# Patient Record
Sex: Male | Born: 1965 | Race: White | Hispanic: No | Marital: Married | Smoking: Never smoker
Health system: Southern US, Community
[De-identification: ages and names within clinical notes are randomized; demographics above are authoritative.]

## PROBLEM LIST (undated history)

## (undated) DIAGNOSIS — K5790 Diverticulosis of intestine, part unspecified, without perforation or abscess without bleeding: Secondary | ICD-10-CM

## (undated) DIAGNOSIS — M109 Gout, unspecified: Secondary | ICD-10-CM

## (undated) DIAGNOSIS — E785 Hyperlipidemia, unspecified: Secondary | ICD-10-CM

## (undated) DIAGNOSIS — F329 Major depressive disorder, single episode, unspecified: Secondary | ICD-10-CM

## (undated) DIAGNOSIS — G43909 Migraine, unspecified, not intractable, without status migrainosus: Secondary | ICD-10-CM

## (undated) DIAGNOSIS — F32A Depression, unspecified: Secondary | ICD-10-CM

## (undated) DIAGNOSIS — M1990 Unspecified osteoarthritis, unspecified site: Secondary | ICD-10-CM

## (undated) DIAGNOSIS — K219 Gastro-esophageal reflux disease without esophagitis: Secondary | ICD-10-CM

## (undated) DIAGNOSIS — C4491 Basal cell carcinoma of skin, unspecified: Secondary | ICD-10-CM

## (undated) DIAGNOSIS — F419 Anxiety disorder, unspecified: Secondary | ICD-10-CM

## (undated) HISTORY — DX: Hyperlipidemia, unspecified: E78.5

## (undated) HISTORY — PX: WISDOM TOOTH EXTRACTION: SHX21

## (undated) HISTORY — DX: Unspecified osteoarthritis, unspecified site: M19.90

## (undated) HISTORY — DX: Gastro-esophageal reflux disease without esophagitis: K21.9

## (undated) HISTORY — PX: APPENDECTOMY: SHX54

## (undated) HISTORY — DX: Major depressive disorder, single episode, unspecified: F32.9

## (undated) HISTORY — DX: Anxiety disorder, unspecified: F41.9

## (undated) HISTORY — DX: Migraine, unspecified, not intractable, without status migrainosus: G43.909

## (undated) HISTORY — PX: LUMBAR MICRODISCECTOMY: SHX99

## (undated) HISTORY — DX: Depression, unspecified: F32.A

## (undated) HISTORY — DX: Gout, unspecified: M10.9

## (undated) HISTORY — DX: Basal cell carcinoma of skin, unspecified: C44.91

---

## 2010-04-05 HISTORY — PX: ANTERIOR CRUCIATE LIGAMENT REPAIR: SHX115

## 2012-12-19 ENCOUNTER — Other Ambulatory Visit: Payer: Self-pay | Admitting: Family Medicine

## 2012-12-20 ENCOUNTER — Other Ambulatory Visit: Payer: Self-pay | Admitting: *Deleted

## 2012-12-20 MED ORDER — FENOFIBRATE 120 MG PO TABS: 1.0000 | ORAL_TABLET | Freq: Every day | ORAL | Status: DC

## 2013-01-09 ENCOUNTER — Other Ambulatory Visit: Payer: Self-pay | Admitting: *Deleted

## 2013-01-09 DIAGNOSIS — E785 Hyperlipidemia, unspecified: Secondary | ICD-10-CM

## 2013-01-10 ENCOUNTER — Other Ambulatory Visit: Payer: Self-pay

## 2013-01-10 LAB — COMPLETE METABOLIC PANEL WITH GFR
ALT: 34 U/L (ref 0–53)
AST: 31 U/L (ref 0–37)
Albumin: 4.5 g/dL (ref 3.5–5.2)
Alkaline Phosphatase: 36 U/L — ABNORMAL LOW (ref 39–117)
BUN: 26 mg/dL — ABNORMAL HIGH (ref 6–23)
CO2: 26 mEq/L (ref 19–32)
Calcium: 9.5 mg/dL (ref 8.4–10.5)
Chloride: 104 mEq/L (ref 96–112)
Creat: 1.51 mg/dL — ABNORMAL HIGH (ref 0.50–1.35)
GFR, Est African American: 63 mL/min
GFR, Est Non African American: 55 mL/min — ABNORMAL LOW
Glucose, Bld: 98 mg/dL (ref 70–99)
Potassium: 4.4 mEq/L (ref 3.5–5.3)
Sodium: 140 mEq/L (ref 135–145)
Total Bilirubin: 0.8 mg/dL (ref 0.3–1.2)
Total Protein: 6.9 g/dL (ref 6.0–8.3)

## 2013-01-10 LAB — LIPID PANEL
Cholesterol: 146 mg/dL (ref 0–200)
HDL: 54 mg/dL (ref >39)
LDL Cholesterol: 70 mg/dL (ref 0–99)
Total CHOL/HDL Ratio: 2.7 Ratio
Triglycerides: 111 mg/dL (ref <150)
VLDL: 22 mg/dL (ref 0–40)

## 2013-01-17 ENCOUNTER — Ambulatory Visit: Payer: Self-pay | Admitting: Family Medicine

## 2013-01-19 ENCOUNTER — Ambulatory Visit: Payer: Self-pay | Admitting: Family Medicine

## 2013-01-19 ENCOUNTER — Encounter: Payer: Self-pay | Admitting: Family Medicine

## 2013-01-19 ENCOUNTER — Ambulatory Visit (INDEPENDENT_AMBULATORY_CARE_PROVIDER_SITE_OTHER): Payer: BC Managed Care – PPO | Admitting: Family Medicine

## 2013-01-19 VITALS — BP 120/66 | HR 64 | Wt 195.0 lb

## 2013-01-19 DIAGNOSIS — E785 Hyperlipidemia, unspecified: Secondary | ICD-10-CM

## 2013-01-19 DIAGNOSIS — M109 Gout, unspecified: Secondary | ICD-10-CM

## 2013-01-19 DIAGNOSIS — K219 Gastro-esophageal reflux disease without esophagitis: Secondary | ICD-10-CM

## 2013-01-19 DIAGNOSIS — M549 Dorsalgia, unspecified: Secondary | ICD-10-CM

## 2013-01-19 MED ORDER — ALLOPURINOL 300 MG PO TABS: 300.0000 mg | ORAL_TABLET | Freq: Every day | ORAL | Status: DC

## 2013-01-19 MED ORDER — PANTOPRAZOLE SODIUM 40 MG PO TBEC: 40.0000 mg | DELAYED_RELEASE_TABLET | Freq: Every day | ORAL | Status: DC

## 2013-01-19 MED ORDER — ROSUVASTATIN CALCIUM 40 MG PO TABS: 40.0000 mg | ORAL_TABLET | Freq: Every day | ORAL | Status: DC

## 2013-01-19 MED ORDER — COLCHICINE 0.6 MG PO TABS: 0.6000 mg | ORAL_TABLET | Freq: Two times a day (BID) | ORAL | Status: AC

## 2013-01-19 MED ORDER — FENOFIBRATE 120 MG PO TABS: 1.0000 | ORAL_TABLET | Freq: Every day | ORAL | Status: DC

## 2013-01-19 NOTE — Patient Instructions (Addendum)
1)  Back - Consider getting a Back Magic and a chiropractic adjustment (Dr. Christell Faith - [place]) vs (Dr. Bryson Ha [facility])  2)   Ear - ENT vs Dr. Bryson Ha  3)  Cholesterol - Perfect on current meds  4)   Kidney Function - Limit the amount of Mobic and other anti-inflammatory (Naproxen, BC. Ibuprofen) and add Tylenol instead.      Chronic Back Pain  When back pain lasts longer than 3 months, it is called chronic back pain.People with chronic back pain often go through certain periods that are more intense (flare-ups).  CAUSES Chronic back pain can be caused by wear and tear (degeneration) on different structures in your back. These structures include:  The bones of your spine (vertebrae) and the joints surrounding your spinal cord and nerve roots (facets).  The strong, fibrous tissues that connect your vertebrae (ligaments). Degeneration of these structures may result in pressure on your nerves. This can lead to constant pain. HOME CARE INSTRUCTIONS  Avoid bending, heavy lifting, prolonged sitting, and activities which make the problem worse.  Take brief periods of rest throughout the day to reduce your pain. Lying down or standing usually is better than sitting while you are resting.  Take over-the-counter or prescription medicines only as directed by your caregiver. SEEK IMMEDIATE MEDICAL CARE IF:   You have weakness or numbness in one of your legs or feet.  You have trouble controlling your bladder or bowels.  You have nausea, vomiting, abdominal pain, shortness of breath, or fainting. Document Released: 07/30/2004 Document Revised: 09/14/2011 Document Reviewed: 06/06/2011 [facility] Patient Information 2014 [place], [place].

## 2013-01-19 NOTE — Progress Notes (Signed)
  Subjective:    Patient ID: Chad Gonzales, male    DOB: 15-May-1966, 47 y.o.   MRN: [id number]  HPI  Tawanna Cooler is here today to go over his most recent lab results, get medication refills and discuss the following conditions:    1)  Ear Pressure:  He continues to feel as if he has fluid in his right ear.  He feels that some days his right ear gets irritated as well.     2)  Gout:  He is well controlled on Allopurinol.   He takes Colcrys when he gets a flare up.  He needs both medications refilled.    3)  Back Pain:  His pain is well controlled with meloxicam and tramadol.  He needs a refill on both.   4)  Mood:  His mood is controlled on Celexa.    5)  Hyperlipidemia:  He continues taking Fenofibrate and Crestor and needs both medications refilled.    Review of Systems  Constitutional: Negative for fatigue.  HENT: Positive for ear pain.        Right ear  Respiratory: Negative.   Cardiovascular: Negative.   Gastrointestinal: Negative.   Genitourinary: Negative.   Musculoskeletal: Negative for myalgias.  Skin: Negative.   Neurological: Negative.   Psychiatric/Behavioral: Negative.     Past Medical History  Diagnosis Date  . Gout, unspecified   . Other and unspecified hyperlipidemia   . Anxiety   . Depression   . Migraine headache   . Basal cell carcinoma   . GERD (gastroesophageal reflux disease)     Family History  Problem Relation Age of Onset  . Cancer Mother     Lung Cancer  . Heart disease Father   . Diabetes Maternal Grandmother     History   Social History Narrative   Marital Status:  Married Chief of Staff)   Children:  Son (Adam); Daughter Irving Burton)    Pets:  Boxer Facilities manager)    Living Situation: Lives with spouse and children.   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  He quit in 04/2009    Alcohol Use: Beer (12 pack/Week)    Drug Use:  None    Diet:  Regular    Exercise:  Basketball, Softball   Hobbies:  Fishing, Golf, Coaching                Objective:   Physical Exam  Constitutional: He appears well-nourished.  HENT:  Head: Normocephalic.  Nose: Nose normal.  Mouth/Throat: Oropharynx is clear and moist.  Eyes: Conjunctivae are normal. No scleral icterus.  Neck: Neck supple. No thyromegaly present.  Cardiovascular: Normal rate, regular rhythm and normal heart sounds.   Pulmonary/Chest: Effort normal and breath sounds normal.  Abdominal: Soft. He exhibits no mass. There is no tenderness.  Musculoskeletal: Normal range of motion.  Lymphadenopathy:    He has no cervical adenopathy.  Neurological: He is alert.  Skin: Skin is warm and dry. No rash noted.  Psychiatric: He has a normal mood and affect. His behavior is normal. Judgment and thought content normal.      Assessment & Plan:

## 2013-02-26 DIAGNOSIS — M549 Dorsalgia, unspecified: Secondary | ICD-10-CM | POA: Insufficient documentation

## 2013-02-26 DIAGNOSIS — E785 Hyperlipidemia, unspecified: Secondary | ICD-10-CM | POA: Insufficient documentation

## 2013-02-26 DIAGNOSIS — K219 Gastro-esophageal reflux disease without esophagitis: Secondary | ICD-10-CM | POA: Insufficient documentation

## 2013-02-26 DIAGNOSIS — M109 Gout, unspecified: Secondary | ICD-10-CM | POA: Insufficient documentation

## 2013-02-26 NOTE — Assessment & Plan Note (Signed)
He will take a combination of Mobic, Ultram and Tylenol.  We also discussed him getting a Back Magic and a chiropractic adjustment.

## 2013-02-26 NOTE — Assessment & Plan Note (Signed)
Refilled his Fenoglide and Crestor.

## 2013-02-26 NOTE — Assessment & Plan Note (Signed)
Refilled his Colcrys and allopurinol.

## 2013-02-26 NOTE — Assessment & Plan Note (Signed)
Refilled his Protonix.   

## 2013-07-18 ENCOUNTER — Other Ambulatory Visit: Payer: Self-pay | Admitting: Family Medicine

## 2013-07-19 ENCOUNTER — Other Ambulatory Visit: Payer: Self-pay | Admitting: *Deleted

## 2013-07-19 DIAGNOSIS — E785 Hyperlipidemia, unspecified: Secondary | ICD-10-CM

## 2013-07-19 DIAGNOSIS — R5383 Other fatigue: Secondary | ICD-10-CM

## 2013-07-19 DIAGNOSIS — R5381 Other malaise: Secondary | ICD-10-CM

## 2013-07-20 ENCOUNTER — Other Ambulatory Visit: Payer: BC Managed Care – PPO

## 2013-07-20 LAB — LIPID PANEL
Cholesterol: 183 mg/dL (ref 0–200)
HDL: 51 mg/dL (ref >39)
LDL Cholesterol: 93 mg/dL (ref 0–99)
Total CHOL/HDL Ratio: 3.6 Ratio
Triglycerides: 196 mg/dL — ABNORMAL HIGH (ref <150)
VLDL: 39 mg/dL (ref 0–40)

## 2013-07-20 LAB — CBC WITH DIFFERENTIAL/PLATELET
Basophils Absolute: 0 10*3/uL (ref 0.0–0.1)
Basophils Relative: 1 % (ref 0–1)
Eosinophils Absolute: 0.2 10*3/uL (ref 0.0–0.7)
Eosinophils Relative: 3 % (ref 0–5)
HCT: 42.3 % (ref 39.0–52.0)
Hemoglobin: 14.9 g/dL (ref 13.0–17.0)
Lymphocytes Relative: 31 % (ref 12–46)
Lymphs Abs: 1.8 10*3/uL (ref 0.7–4.0)
MCH: 31.6 pg (ref 26.0–34.0)
MCHC: 35.2 g/dL (ref 30.0–36.0)
MCV: 89.6 fL (ref 78.0–100.0)
Monocytes Absolute: 0.5 10*3/uL (ref 0.1–1.0)
Monocytes Relative: 8 % (ref 3–12)
Neutro Abs: 3.2 10*3/uL (ref 1.7–7.7)
Neutrophils Relative %: 57 % (ref 43–77)
Platelets: 221 10*3/uL (ref 150–400)
RBC: 4.72 MIL/uL (ref 4.22–5.81)
RDW: 13.3 % (ref 11.5–15.5)
WBC: 5.7 10*3/uL (ref 4.0–10.5)

## 2013-07-20 LAB — COMPLETE METABOLIC PANEL WITH GFR
ALT: 35 U/L (ref 0–53)
AST: 19 U/L (ref 0–37)
Albumin: 4.6 g/dL (ref 3.5–5.2)
Alkaline Phosphatase: 46 U/L (ref 39–117)
BUN: 20 mg/dL (ref 6–23)
CO2: 27 mEq/L (ref 19–32)
Calcium: 9.4 mg/dL (ref 8.4–10.5)
Chloride: 100 mEq/L (ref 96–112)
Creat: 1.27 mg/dL (ref 0.50–1.35)
GFR, Est African American: 77 mL/min
GFR, Est Non African American: 67 mL/min
Glucose, Bld: 94 mg/dL (ref 70–99)
Potassium: 4.4 mEq/L (ref 3.5–5.3)
Sodium: 138 mEq/L (ref 135–145)
Total Bilirubin: 0.9 mg/dL (ref 0.3–1.2)
Total Protein: 7 g/dL (ref 6.0–8.3)

## 2013-07-21 LAB — TSH: TSH: 4.449 u[IU]/mL (ref 0.350–4.500)

## 2013-07-25 ENCOUNTER — Encounter: Payer: Self-pay | Admitting: Family Medicine

## 2013-07-25 ENCOUNTER — Ambulatory Visit (INDEPENDENT_AMBULATORY_CARE_PROVIDER_SITE_OTHER): Payer: BC Managed Care – PPO | Admitting: Family Medicine

## 2013-07-25 VITALS — BP 131/87 | HR 72 | Resp 16 | Ht 69.0 in | Wt 191.0 lb

## 2013-07-25 DIAGNOSIS — E785 Hyperlipidemia, unspecified: Secondary | ICD-10-CM

## 2013-07-25 DIAGNOSIS — M109 Gout, unspecified: Secondary | ICD-10-CM

## 2013-07-25 DIAGNOSIS — K219 Gastro-esophageal reflux disease without esophagitis: Secondary | ICD-10-CM

## 2013-07-25 MED ORDER — PANTOPRAZOLE SODIUM 40 MG PO TBEC: 40.0000 mg | DELAYED_RELEASE_TABLET | Freq: Every day | ORAL | Status: DC

## 2013-07-25 MED ORDER — ALLOPURINOL 300 MG PO TABS: 300.0000 mg | ORAL_TABLET | Freq: Every day | ORAL | Status: DC

## 2013-07-25 MED ORDER — ATORVASTATIN CALCIUM 40 MG PO TABS: 40.0000 mg | ORAL_TABLET | Freq: Every day | ORAL | Status: AC

## 2013-07-25 MED ORDER — FENOFIBRATE 145 MG PO TABS: 145.0000 mg | ORAL_TABLET | Freq: Every day | ORAL | Status: DC

## 2013-07-25 MED ORDER — ROSUVASTATIN CALCIUM 40 MG PO TABS: ORAL_TABLET | ORAL | Status: DC

## 2013-07-25 NOTE — Progress Notes (Signed)
Subjective:    Patient ID: Chad Gonzales, male    DOB: June 05, 1966, 48 y.o.   MRN: [id number]  HPI  Chad Gonzales is here today to go over his most recent lab results and to discuss the conditions listed below:   1)  Hyperlipidemia:  He takes a combination of Crestor 20 mg and Fenoglide 120 mg daily.  His insurance is not covering the Fenoglide so he would like to be changed to another medication that is more affordable.    2)  GERD:  He has been experiencing more heartburn since his last surgery.  He continues taking his pantoprazole (40 mg once daily).  He needs a refill on it.    3  Gout:  His symptoms are controlled.      Review of Systems  Constitutional: Negative for fatigue and unexpected weight change.  HENT: Negative.   Respiratory: Negative for shortness of breath.   Cardiovascular: Negative for chest pain, palpitations and leg swelling.  Gastrointestinal: Negative.        Mild acid reflux  Genitourinary: Negative.   Musculoskeletal: Negative for myalgias.  Skin: Negative.   Neurological: Negative.   Psychiatric/Behavioral: Negative.   All other systems reviewed and are negative.    Past Medical History  Diagnosis Date  . Gout, unspecified   . Other and unspecified hyperlipidemia   . Anxiety   . Depression   . Migraine headache   . Basal cell carcinoma   . GERD (gastroesophageal reflux disease)   . Arthritis      Past Surgical History  Procedure Laterality Date  . Anterior cruciate ligament repair  04/2010    Right knee  . Appendectomy    . Lumbar microdiscectomy  L4-L5     History   Social History Narrative   Marital Status:  Married Building services engineer)   Children:  Son (Adam); Daughter Raquel Sarna)    Pets:  Boxer Forensic psychologist)    Living Situation: Lives with spouse and children.   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  He quit in 04/2009    Alcohol Use: Beer (12  pack/Week)    Drug Use:  None    Diet:  Regular    Exercise:  Basketball, Softball   Hobbies:  Fishing, Golf, Nutritional therapist              Family History  Problem Relation Age of Onset  . Cancer Mother     Lung Cancer  . Heart disease Father   . Diabetes Maternal Grandmother      Current Outpatient Prescriptions on File Prior to Visit  Medication Sig Dispense Refill  . citalopram (CELEXA) 20 MG tablet Take 20 mg by mouth daily.      . Fenofibrate (FENOGLIDE) 120 MG TABS Take 1 tablet (120 mg total) by mouth daily.  30 each  5  . meloxicam (MOBIC) 7.5 MG tablet       . zolpidem (AMBIEN) 10 MG tablet Take 10 mg by mouth at bedtime as needed for sleep.      . colchicine (COLCRYS) 0.6 MG tablet Take 1 tablet (0.6 mg total) by mouth 2 (two) times daily. PRN For gout attack  60 tablet  11   No current facility-administered medications on file prior to visit.     No Known Allergies   Immunization History  Administered Date(s) Administered  . Tdap 05/01/2009  Objective:   Physical Exam  Nursing note and vitals reviewed. Constitutional: He is oriented to person, place, and time. He appears well-nourished. No distress.  HENT:  Head: Normocephalic.  Eyes: No scleral icterus.  Neck: Neck supple. No thyromegaly present.  Cardiovascular: Normal rate, regular rhythm and normal heart sounds.  Exam reveals no gallop and no friction rub.   No murmur heard. Pulmonary/Chest: Breath sounds normal. No respiratory distress. He exhibits no tenderness.  Musculoskeletal: He exhibits no edema.  Neurological: He is alert and oriented to person, place, and time.  Skin: Skin is warm and dry. No rash noted.  Psychiatric: He has a normal mood and affect. His behavior is normal. Judgment and thought content normal.       Assessment & Plan:   Veldon was seen today for medication management.  Diagnoses and associated orders for this visit:  Gout - allopurinol (ZYLOPRIM) 300 MG tablet;  Take 1 tablet (300 mg total) by mouth daily.  Other and unspecified hyperlipidemia - fenofibrate (TRICOR) 145 MG tablet; Take 1 tablet (145 mg total) by mouth daily. - rosuvastatin (CRESTOR) 40 MG tablet; Take 1/2 - 1 tab daily for cholesterol - atorvastatin (LIPITOR) 40 MG tablet; Take 1 tablet (40 mg total) by mouth daily.  GERD (gastroesophageal reflux disease) - pantoprazole (PROTONIX) 40 MG tablet; Take 1 tablet (40 mg total) by mouth daily.

## 2013-07-25 NOTE — Patient Instructions (Signed)
1)  Cholesterol - We are changing from Fenoglide 120 to a generic fenofibrate at 145 mg.  You will also take a statin - either Crestor 20 mg like you have been doing or you can try atorvastatin (generic for Lipitor) 40 mg daily.  Let's recheck your levels in 6 months to be sure that you have the same kind of control.  2)  GERD - Take the pantoprazole from [facility].     Hypertriglyceridemia  Diet for High blood levels of Triglycerides Most fats in food are triglycerides. Triglycerides in your blood are stored as fat in your body. High levels of triglycerides in your blood may put you at a greater risk for heart disease and stroke.  Normal triglyceride levels are less than 150 mg/dL. Borderline high levels are 150-199 mg/dl. High levels are 200 - 499 mg/dL, and very high triglyceride levels are greater than 500 mg/dL. The decision to treat high triglycerides is generally based on the level. For people with borderline or high triglyceride levels, treatment includes weight loss and exercise. Drugs are recommended for people with very high triglyceride levels. Many people who need treatment for high triglyceride levels have metabolic syndrome. This syndrome is a collection of disorders that often include: insulin resistance, high blood pressure, blood clotting problems, high cholesterol and triglycerides. TESTING PROCEDURE FOR TRIGLYCERIDES  You should not eat 4 hours before getting your triglycerides measured. The normal range of triglycerides is between 10 and 250 milligrams per deciliter (mg/dl). Some people may have extreme levels (1000 or above), but your triglyceride level may be too high if it is above 150 mg/dl, depending on what other risk factors you have for heart disease.  People with high blood triglycerides may also have high blood cholesterol levels. If you have high blood cholesterol as well as high blood triglycerides, your risk for heart disease is probably greater than if you only had  high triglycerides. High blood cholesterol is one of the main risk factors for heart disease. CHANGING YOUR DIET  Your weight can affect your blood triglyceride level. If you are more than 20% above your ideal body weight, you may be able to lower your blood triglycerides by losing weight. Eating less and exercising regularly is the best way to combat this. Fat provides more calories than any other food. The best way to lose weight is to eat less fat. Only 30% of your total calories should come from fat. Less than 7% of your diet should come from saturated fat. A diet low in fat and saturated fat is the same as a diet to decrease blood cholesterol. By eating a diet lower in fat, you may lose weight, lower your blood cholesterol, and lower your blood triglyceride level.  Eating a diet low in fat, especially saturated fat, may also help you lower your blood triglyceride level. Ask your dietitian to help you figure how much fat you can eat based on the number of calories your caregiver has prescribed for you.  Exercise, in addition to helping with weight loss may also help lower triglyceride levels.   Alcohol can increase blood triglycerides. You may need to stop drinking alcoholic beverages.  Too much carbohydrate in your diet may also increase your blood triglycerides. Some complex carbohydrates are necessary in your diet. These may include bread, rice, potatoes, other starchy vegetables and cereals.  Reduce "simple" carbohydrates. These may include pure sugars, candy, honey, and jelly without losing other nutrients. If you have the kind of high  blood triglycerides that is affected by the amount of carbohydrates in your diet, you will need to eat less sugar and less high-sugar foods. Your caregiver can help you with this.  Adding 2-4 grams of fish oil (EPA+ DHA) may also help lower triglycerides. Speak with your caregiver before adding any supplements to your regimen. Following the Diet  Maintain your  ideal weight. Your caregivers can help you with a diet. Generally, eating less food and getting more exercise will help you lose weight. Joining a weight control group may also help. Ask your caregivers for a good weight control group in your area.  Eat low-fat foods instead of high-fat foods. This can help you lose weight too.  These foods are lower in fat. Eat MORE of these:   Dried beans, peas, and lentils.  Egg whites.  Low-fat cottage cheese.  Fish.  Lean cuts of meat, such as round, sirloin, rump, and flank (cut extra fat off meat you fix).  Whole grain breads, cereals and pasta.  Skim and nonfat dry milk.  Low-fat yogurt.  Poultry without the skin.  Cheese made with skim or part-skim milk, such as mozzarella, parmesan, farmers', ricotta, or pot cheese. These are higher fat foods. Eat LESS of these:   Whole milk and foods made from whole milk, such as American, blue, cheddar, monterey jack, and swiss cheese  High-fat meats, such as luncheon meats, sausages, knockwurst, bratwurst, hot dogs, ribs, corned beef, ground pork, and regular ground beef.  Fried foods. Limit saturated fats in your diet. Substituting unsaturated fat for saturated fat may decrease your blood triglyceride level. You will need to read package labels to know which products contain saturated fats.  These foods are high in saturated fat. Eat LESS of these:   Fried pork skins.  Whole milk.  Skin and fat from poultry.  Palm oil.  Butter.  Shortening.  Cream cheese.  Berniece Salines.  Margarines and baked goods made from listed oils.  Vegetable shortenings.  Chitterlings.  Fat from meats.  Coconut oil.  Palm kernel oil.  Lard.  Cream.  Sour cream.  Fatback.  Coffee whiteners and non-dairy creamers made with these oils.  Cheese made from whole milk. Use unsaturated fats (both polyunsaturated and monounsaturated) moderately. Remember, even though unsaturated fats are better than  saturated fats; you still want a diet low in total fat.  These foods are high in unsaturated fat:   Canola oil.  Sunflower oil.  Mayonnaise.  Almonds.  Peanuts.  Pine nuts.  Margarines made with these oils.  Safflower oil.  Olive oil.  Avocados.  Cashews.  Peanut butter.  Sunflower seeds.  Soybean oil.  Peanut oil.  Olives.  Pecans.  Walnuts.  Pumpkin seeds. Avoid sugar and other high-sugar foods. This will decrease carbohydrates without decreasing other nutrients. Sugar in your food goes rapidly to your blood. When there is excess sugar in your blood, your liver may use it to make more triglycerides. Sugar also contains calories without other important nutrients.  Eat LESS of these:   Sugar, brown sugar, powdered sugar, jam, jelly, preserves, honey, syrup, molasses, pies, candy, cakes, cookies, frosting, pastries, colas, soft drinks, punches, fruit drinks, and regular gelatin.  Avoid alcohol. Alcohol, even more than sugar, may increase blood triglycerides. In addition, alcohol is high in calories and low in nutrients. Ask for sparkling water, or a diet soft drink instead of an alcoholic beverage. Suggestions for planning and preparing meals   Bake, broil, grill or roast meats instead  of frying.  Remove fat from meats and skin from poultry before cooking.  Add spices, herbs, lemon juice or vinegar to vegetables instead of salt, rich sauces or gravies.  Use a non-stick skillet without fat or use no-stick sprays.  Cool and refrigerate stews and broth. Then remove the hardened fat floating on the surface before serving.  Refrigerate meat drippings and skim off fat to make low-fat gravies.  Serve more fish.  Use less butter, margarine and other high-fat spreads on bread or vegetables.  Use skim or reconstituted non-fat dry milk for cooking.  Cook with low-fat cheeses.  Substitute low-fat yogurt or cottage cheese for all or part of the sour cream in  recipes for sauces, dips or congealed salads.  Use half yogurt/half mayonnaise in salad recipes.  Substitute evaporated skim milk for cream. Evaporated skim milk or reconstituted non-fat dry milk can be whipped and substituted for whipped cream in certain recipes.  Choose fresh fruits for dessert instead of high-fat foods such as pies or cakes. Fruits are naturally low in fat. When Dining Out   Order low-fat appetizers such as fruit or vegetable juice, pasta with vegetables or tomato sauce.  Select clear, rather than cream soups.  Ask that dressings and gravies be served on the side. Then use less of them.  Order foods that are baked, broiled, poached, steamed, stir-fried, or roasted.  Ask for margarine instead of butter, and use only a small amount.  Drink sparkling water, unsweetened tea or coffee, or diet soft drinks instead of alcohol or other sweet beverages. QUESTIONS AND ANSWERS ABOUT OTHER FATS IN THE BLOOD: SATURATED FAT, TRANS FAT, AND CHOLESTEROL What is trans fat? Trans fat is a type of fat that is formed when vegetable oil is hardened through a process called hydrogenation. This process helps makes foods more solid, gives them shape, and prolongs their shelf life. Trans fats are also called hydrogenated or partially hydrogenated oils.  What do saturated fat, trans fat, and cholesterol in foods have to do with heart disease? Saturated fat, trans fat, and cholesterol in the diet all raise the level of LDL "bad" cholesterol in the blood. The higher the LDL cholesterol, the greater the risk for coronary heart disease (CHD). Saturated fat and trans fat raise LDL similarly.  What foods contain saturated fat, trans fat, and cholesterol? High amounts of saturated fat are found in animal products, such as fatty cuts of meat, chicken skin, and full-fat dairy products like butter, whole milk, cream, and cheese, and in tropical vegetable oils such as palm, palm kernel, and coconut oil.  Trans fat is found in some of the same foods as saturated fat, such as vegetable shortening, some margarines (especially hard or stick margarine), crackers, cookies, baked goods, fried foods, salad dressings, and other processed foods made with partially hydrogenated vegetable oils. Small amounts of trans fat also occur naturally in some animal products, such as milk products, beef, and lamb. Foods high in cholesterol include liver, other organ meats, egg yolks, shrimp, and full-fat dairy products. How can I use the new food label to make heart-healthy food choices? Check the Nutrition Facts panel of the food label. Choose foods lower in saturated fat, trans fat, and cholesterol. For saturated fat and cholesterol, you can also use the Percent Daily Value (%DV): 5% DV or less is low, and 20% DV or more is high. (There is no %DV for trans fat.) Use the Nutrition Facts panel to choose foods low in saturated fat  and cholesterol, and if the trans fat is not listed, read the ingredients and limit products that list shortening or hydrogenated or partially hydrogenated vegetable oil, which tend to be high in trans fat. POINTS TO REMEMBER:   Discuss your risk for heart disease with your caregivers, and take steps to reduce risk factors.  Change your diet. Choose foods that are low in saturated fat, trans fat, and cholesterol.  Add exercise to your daily routine if it is not already being done. Participate in physical activity of moderate intensity, like brisk walking, for at least 30 minutes on most, and preferably all days of the week. No time? Break the 30 minutes into three, 10-minute segments during the day.  Stop smoking. If you do smoke, contact your caregiver to discuss ways in which they can help you quit.  Do not use street drugs.  Maintain a normal weight.  Maintain a healthy blood pressure.  Keep up with your blood work for checking the fats in your blood as directed by your caregiver. Document  Released: 04/09/2004 Document Revised: 12/22/2011 Document Reviewed: 11/05/2008 [facility] Patient Information 2014 [place].

## 2013-08-17 ENCOUNTER — Other Ambulatory Visit: Payer: Self-pay | Admitting: Neurosurgery

## 2013-08-17 DIAGNOSIS — M545 Low back pain, unspecified: Secondary | ICD-10-CM

## 2013-08-18 ENCOUNTER — Ambulatory Visit
Admission: RE | Admit: 2013-08-18 | Discharge: 2013-08-18 | Disposition: A | Discharge status: Home / Self Care | Payer: BC Managed Care – PPO | Source: Ambulatory Visit | Attending: Neurosurgery | Admitting: Neurosurgery

## 2013-08-18 VITALS — BP 142/82 | HR 71

## 2013-08-18 DIAGNOSIS — M545 Low back pain, unspecified: Secondary | ICD-10-CM

## 2013-08-18 MED ORDER — METHYLPREDNISOLONE ACETATE 40 MG/ML INJ SUSP (RADIOLOG
120.0000 mg | Freq: Once | INTRAMUSCULAR | Status: AC
  Administered 2013-08-18: 120 mg via INTRA_ARTICULAR

## 2013-08-18 MED ORDER — IOHEXOL 180 MG/ML  SOLN
1.0000 mL | Freq: Once | INTRAMUSCULAR | Status: AC | PRN
  Administered 2013-08-18: 1 mL via INTRA_ARTICULAR

## 2013-08-18 NOTE — Discharge Instructions (Signed)

## 2013-11-01 ENCOUNTER — Other Ambulatory Visit: Payer: Self-pay | Admitting: Neurosurgery

## 2013-11-01 DIAGNOSIS — M545 Low back pain, unspecified: Secondary | ICD-10-CM

## 2013-11-02 ENCOUNTER — Ambulatory Visit
Admission: RE | Admit: 2013-11-02 | Discharge: 2013-11-02 | Disposition: A | Discharge status: Home / Self Care | Payer: BC Managed Care – PPO | Source: Ambulatory Visit | Attending: Neurosurgery | Admitting: Neurosurgery

## 2013-11-02 DIAGNOSIS — M545 Low back pain, unspecified: Secondary | ICD-10-CM

## 2013-11-02 MED ORDER — METHYLPREDNISOLONE ACETATE 40 MG/ML INJ SUSP (RADIOLOG
120.0000 mg | Freq: Once | INTRAMUSCULAR | Status: AC
  Administered 2013-11-02: 120 mg via INTRA_ARTICULAR

## 2013-11-02 MED ORDER — IOHEXOL 180 MG/ML  SOLN
1.0000 mL | Freq: Once | INTRAMUSCULAR | Status: AC | PRN
  Administered 2013-11-02: 1 mL via INTRA_ARTICULAR

## 2014-01-08 ENCOUNTER — Other Ambulatory Visit: Payer: Self-pay | Admitting: *Deleted

## 2014-01-08 DIAGNOSIS — E785 Hyperlipidemia, unspecified: Secondary | ICD-10-CM

## 2014-01-09 ENCOUNTER — Other Ambulatory Visit: Payer: BC Managed Care – PPO

## 2014-01-09 LAB — COMPLETE METABOLIC PANEL WITH GFR
ALT: 19 U/L (ref 0–53)
AST: 19 U/L (ref 0–37)
Albumin: 4.3 g/dL (ref 3.5–5.2)
Alkaline Phosphatase: 30 U/L — ABNORMAL LOW (ref 39–117)
BUN: 23 mg/dL (ref 6–23)
CO2: 24 mEq/L (ref 19–32)
Calcium: 9.1 mg/dL (ref 8.4–10.5)
Chloride: 107 mEq/L (ref 96–112)
Creat: 1.37 mg/dL — ABNORMAL HIGH (ref 0.50–1.35)
GFR, Est African American: 70 mL/min
GFR, Est Non African American: 61 mL/min
Glucose, Bld: 104 mg/dL — ABNORMAL HIGH (ref 70–99)
Potassium: 4.1 mEq/L (ref 3.5–5.3)
Sodium: 140 mEq/L (ref 135–145)
Total Bilirubin: 0.6 mg/dL (ref 0.2–1.2)
Total Protein: 6.6 g/dL (ref 6.0–8.3)

## 2014-01-09 LAB — LIPID PANEL
Cholesterol: 155 mg/dL (ref 0–200)
HDL: 42 mg/dL (ref >39)
LDL Cholesterol: 56 mg/dL (ref 0–99)
Total CHOL/HDL Ratio: 3.7 Ratio
Triglycerides: 284 mg/dL — ABNORMAL HIGH (ref <150)
VLDL: 57 mg/dL — ABNORMAL HIGH (ref 0–40)

## 2014-01-19 ENCOUNTER — Encounter: Payer: Self-pay | Admitting: Family Medicine

## 2014-01-19 ENCOUNTER — Ambulatory Visit (INDEPENDENT_AMBULATORY_CARE_PROVIDER_SITE_OTHER): Payer: BC Managed Care – PPO | Admitting: Family Medicine

## 2014-01-19 VITALS — BP 131/85 | HR 65 | Resp 18 | Ht 69.0 in | Wt 202.0 lb

## 2014-01-19 DIAGNOSIS — K219 Gastro-esophageal reflux disease without esophagitis: Secondary | ICD-10-CM

## 2014-01-19 DIAGNOSIS — M109 Gout, unspecified: Secondary | ICD-10-CM

## 2014-01-19 DIAGNOSIS — E785 Hyperlipidemia, unspecified: Secondary | ICD-10-CM

## 2014-01-19 MED ORDER — FENOFIBRATE 145 MG PO TABS: 145.0000 mg | ORAL_TABLET | Freq: Every day | ORAL | Status: DC

## 2014-01-19 MED ORDER — PANTOPRAZOLE SODIUM 40 MG PO TBEC: 40.0000 mg | DELAYED_RELEASE_TABLET | Freq: Every day | ORAL | Status: AC

## 2014-01-19 MED ORDER — ALLOPURINOL 300 MG PO TABS: 300.0000 mg | ORAL_TABLET | Freq: Every day | ORAL | Status: AC

## 2014-01-19 NOTE — Progress Notes (Signed)
Subjective:    Patient ID: Chad Gonzales, male    DOB: Apr 02, 1966, 48 y.o.   MRN: [id number]  HPI  Chad Gonzales is here today to follow up on his lab results. He is needing to get medication refills.  1)  Hyperlipidemia:  He is doing well on the Lipitor and Tricor.  2)  Anxiety:  He is doing well on the Celexa. Dr. Jake Samples writes this for him.  3)  GERD:  He needs a refill on the Protonix.   4)  Gout:  He takes allopurinol. He needs a refill.   Review of Systems  Constitutional: Negative for activity change, appetite change and fatigue.  Cardiovascular: Negative for chest pain, palpitations and leg swelling.  Psychiatric/Behavioral: Negative for behavioral problems and sleep disturbance. The patient is not nervous/anxious.   All other systems reviewed and are negative.    Past Medical History  Diagnosis Date  . Gout, unspecified   . Other and unspecified hyperlipidemia   . Anxiety   . Depression   . Migraine headache   . Basal cell carcinoma   . GERD (gastroesophageal reflux disease)   . Arthritis      Past Surgical History  Procedure Laterality Date  . Anterior cruciate ligament repair  04/2010    Right knee  . Appendectomy    . Lumbar microdiscectomy  L4-L5     History   Social History Narrative   Marital Status:  Married Building services engineer)   Children:  Son (Adam); Daughter Raquel Sarna)    Pets:  Boxer Forensic psychologist)    Living Situation: Lives with spouse and children.   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  He quit in 04/2009    Alcohol Use: Beer (12 pack/Week)    Drug Use:  None    Diet:  Regular    Exercise:  Basketball, Softball   Hobbies:  Fishing, Golf, Nutritional therapist              Family History  Problem Relation Age of Onset  . Cancer Mother     Lung Cancer  . Heart disease Father   . Diabetes Maternal Grandmother      Current Outpatient Prescriptions on File Prior  to Visit  Medication Sig Dispense Refill  . atorvastatin (LIPITOR) 40 MG tablet Take 1 tablet (40 mg total) by mouth daily.  30 tablet  5  . citalopram (CELEXA) 20 MG tablet Take 20 mg by mouth daily.      . colchicine (COLCRYS) 0.6 MG tablet Take 1 tablet (0.6 mg total) by mouth 2 (two) times daily. PRN For gout attack  60 tablet  11  . meloxicam (MOBIC) 7.5 MG tablet       . zolpidem (AMBIEN) 10 MG tablet Take 10 mg by mouth at bedtime as needed for sleep.       No current facility-administered medications on file prior to visit.     Allergies  Allergen Reactions  . Codeine Nausea Only     Immunization History  Administered Date(s) Administered  . Tdap 05/01/2009       Objective:   Physical Exam  Nursing note and vitals reviewed. Constitutional: He is oriented to person, place, and time. He appears well-nourished. No distress.  HENT:  Head: Normocephalic.  Eyes: No scleral icterus.  Neck: Neck supple. No thyromegaly present.  Cardiovascular: Normal rate, regular rhythm and normal heart sounds.  Exam reveals  no gallop and no friction rub.   No murmur heard. Pulmonary/Chest: Breath sounds normal. No respiratory distress. He exhibits no tenderness.  Musculoskeletal: He exhibits no edema.  Neurological: He is alert and oriented to person, place, and time.  Skin: Skin is warm and dry. No rash noted.  Psychiatric: He has a normal mood and affect. His behavior is normal. Judgment and thought content normal.      Assessment & Plan:    Chad Gonzales was seen today for medication management.  Diagnoses and associated orders for this visit:  Other and unspecified hyperlipidemia - fenofibrate (TRICOR) 145 MG tablet; Take 1 tablet (145 mg total) by mouth daily.  Gastroesophageal reflux disease without esophagitis - pantoprazole (PROTONIX) 40 MG tablet; Take 1 tablet (40 mg total) by mouth daily.  Gout of right foot, unspecified cause, unspecified chronicity - allopurinol  (ZYLOPRIM) 300 MG tablet; Take 1 tablet (300 mg total) by mouth daily.   TIME SPENT "FACE TO FACE" WITH PATIENT -  30 MINS

## 2014-01-19 NOTE — Patient Instructions (Addendum)
1)  Kidney Function - Try to limit your intake of NSAIDS and drink plenty of water.    2)  Triglycerides - Limit your intake of carbs/fatty foods.   3)  LDL - It is more perfect than it has to be so you can take 1/2 of the Lipitor.     Food Choices to Lower Your Triglycerides  Triglycerides are a type of fat in your blood. High levels of triglycerides can increase the risk of heart disease and stroke. If your triglyceride levels are high, the foods you eat and your eating habits are very important. Choosing the right foods can help lower your triglycerides.  WHAT GENERAL GUIDELINES DO I NEED TO FOLLOW?  Lose weight if you are overweight.   Limit or avoid alcohol.   Fill one half of your plate with vegetables and green salads.   Limit fruit to two servings a day. Choose fruit instead of juice.   Make one fourth of your plate whole grains. Look for the word "whole" as the first word in the ingredient list.  Fill one fourth of your plate with lean protein foods.  Enjoy fatty fish (such as salmon, mackerel, sardines, and tuna) three times a week.   Choose healthy fats.   Limit foods high in starch and sugar.  Eat more home-cooked food and less restaurant, buffet, and fast food.  Limit fried foods.  Cook foods using methods other than frying.  Limit saturated fats.  Check ingredient lists to avoid foods with partially hydrogenated oils (trans fats) in them. WHAT FOODS CAN I EAT?  Grains Whole grains, such as whole wheat or whole grain breads, crackers, cereals, and pasta. Unsweetened oatmeal, bulgur, barley, quinoa, or brown rice. Corn or whole wheat flour tortillas.  Vegetables Fresh or frozen vegetables (raw, steamed, roasted, or grilled). Green salads. Fruits All fresh, canned (in natural juice), or frozen fruits. Meat and Other Protein Products Ground beef (85% or leaner), grass-fed beef, or beef trimmed of fat. Skinless chicken or [place]. Ground chicken or [place].  Pork trimmed of fat. All fish and seafood. Eggs. Dried beans, peas, or lentils. Unsalted nuts or seeds. Unsalted canned or dry beans. Dairy Low-fat dairy products, such as skim or 1% milk, 2% or reduced-fat cheeses, low-fat ricotta or cottage cheese, or plain low-fat yogurt. Fats and Oils Tub margarines without trans fats. Light or reduced-fat mayonnaise and salad dressings. Avocado. Safflower, olive, or canola oils. Natural peanut or almond butter. The items listed above may not be a complete list of recommended foods or beverages. Contact your dietitian for more options. WHAT FOODS ARE NOT RECOMMENDED?  Grains White bread. White pasta. White rice. Cornbread. Bagels, pastries, and croissants. Crackers that contain trans fat. Vegetables White potatoes. Corn. Creamed or fried vegetables. Vegetables in a cheese sauce. Fruits Dried fruits. Canned fruit in light or heavy syrup. Fruit juice. Meat and Other Protein Products Fatty cuts of meat. Ribs, chicken wings, bacon, sausage, bologna, salami, chitterlings, fatback, hot dogs, bratwurst, and packaged luncheon meats. Dairy Whole or 2% milk, cream, half-and-half, and cream cheese. Whole-fat or sweetened yogurt. Full-fat cheeses. Nondairy creamers and whipped toppings. Processed cheese, cheese spreads, or cheese curds. Sweets and Desserts Corn syrup, sugars, honey, and molasses. Candy. Jam and jelly. Syrup. Sweetened cereals. Cookies, pies, cakes, donuts, muffins, and ice cream. Fats and Oils Butter, stick margarine, lard, shortening, ghee, or bacon fat. Coconut, palm kernel, or palm oils. Beverages Alcohol. Sweetened drinks (such as sodas, lemonade, and fruit drinks or punches).  The items listed above may not be a complete list of foods and beverages to avoid. Contact your dietitian for more information. Document Released: 04/09/2004 Document Revised: 06/27/2013 Document Reviewed: 04/26/2013 [facility] Patient Information 2015 [place], [place]. This  information is not intended to replace advice given to you by your health care provider. Make sure you discuss any questions you have with your health care provider.

## 2014-01-23 ENCOUNTER — Other Ambulatory Visit: Payer: BC Managed Care – PPO

## 2014-01-26 ENCOUNTER — Ambulatory Visit: Payer: BC Managed Care – PPO | Admitting: Family Medicine

## 2014-02-06 ENCOUNTER — Other Ambulatory Visit: Payer: Self-pay | Admitting: Family Medicine

## 2016-12-04 ENCOUNTER — Encounter (HOSPITAL_BASED_OUTPATIENT_CLINIC_OR_DEPARTMENT_OTHER): Payer: Self-pay | Admitting: *Deleted

## 2016-12-04 ENCOUNTER — Emergency Department (HOSPITAL_BASED_OUTPATIENT_CLINIC_OR_DEPARTMENT_OTHER)
Admission: EM | Admit: 2016-12-04 | Discharge: 2016-12-04 | Disposition: A | Discharge status: Home / Self Care | Payer: BLUE CROSS/BLUE SHIELD | Attending: Emergency Medicine | Admitting: Emergency Medicine

## 2016-12-04 DIAGNOSIS — Z87891 Personal history of nicotine dependence: Secondary | ICD-10-CM | POA: Diagnosis not present

## 2016-12-04 DIAGNOSIS — Z79899 Other long term (current) drug therapy: Secondary | ICD-10-CM | POA: Insufficient documentation

## 2016-12-04 DIAGNOSIS — K5700 Diverticulitis of small intestine with perforation and abscess without bleeding: Secondary | ICD-10-CM | POA: Insufficient documentation

## 2016-12-04 DIAGNOSIS — R1030 Lower abdominal pain, unspecified: Secondary | ICD-10-CM | POA: Diagnosis present

## 2016-12-04 DIAGNOSIS — K5792 Diverticulitis of intestine, part unspecified, without perforation or abscess without bleeding: Secondary | ICD-10-CM

## 2016-12-04 DIAGNOSIS — R1032 Left lower quadrant pain: Secondary | ICD-10-CM | POA: Diagnosis not present

## 2016-12-04 HISTORY — DX: Diverticulosis of intestine, part unspecified, without perforation or abscess without bleeding: K57.90

## 2016-12-04 LAB — URINALYSIS, ROUTINE W REFLEX MICROSCOPIC
BILIRUBIN URINE: NEGATIVE
Glucose, UA: NEGATIVE mg/dL
Hgb urine dipstick: NEGATIVE
Ketones, ur: NEGATIVE mg/dL
LEUKOCYTES UA: NEGATIVE
NITRITE: NEGATIVE
PH: 6.5 (ref 5.0–8.0)
Protein, ur: NEGATIVE mg/dL
Specific Gravity, Urine: 1.021 (ref 1.005–1.030)

## 2016-12-04 MED ORDER — IBUPROFEN 400 MG PO TABS
600.0000 mg | ORAL_TABLET | Freq: Once | ORAL | Status: AC
  Administered 2016-12-04: 600 mg via ORAL
  Filled 2016-12-04: qty 1

## 2016-12-04 MED ORDER — AMOXICILLIN-POT CLAVULANATE 875-125 MG PO TABS: 1.0000 | ORAL_TABLET | Freq: Two times a day (BID) | ORAL | 0 refills | Status: DC

## 2016-12-04 NOTE — ED Provider Notes (Signed)
[facility] Provider Note   CSN: [id number] Arrival date & time: 12/04/16  4098     History   Chief Complaint Chief Complaint  Patient presents with  . Abdominal Pain    HPI Chad Gonzales is a 51 y.o. male.  Patient with diverticulitis history presents with left lower abdominal bloating and discomfort suite. Chills without documented fever. Patient had appendix removed in the past. Patient has history of gout as well. Patient has urinary pressure but no discomfort. No history of kidney stones or urinary infection.      Past Medical History:  Diagnosis Date  . Anxiety   . Arthritis   . Basal cell carcinoma   . Depression   . Diverticulosis   . GERD (gastroesophageal reflux disease)   . Gout, unspecified   . Migraine headache   . Other and unspecified hyperlipidemia     Patient Active Problem List   Diagnosis Date Noted  . Other and unspecified hyperlipidemia 02/26/2013  . Gout 02/26/2013  . GERD (gastroesophageal reflux disease) 02/26/2013  . Back pain 02/26/2013    Past Surgical History:  Procedure Laterality Date  . ANTERIOR CRUCIATE LIGAMENT REPAIR  04/2010   Right knee  . APPENDECTOMY    . LUMBAR MICRODISCECTOMY  L4-L5  . WISDOM TOOTH EXTRACTION         Home Medications    Prior to Admission medications   Medication Sig Start Date End Date Taking? Authorizing Provider  allopurinol (ZYLOPRIM) 300 MG tablet Take 1 tablet (300 mg total) by mouth daily. 01/19/14 12/04/16 Yes Zanard, Bernadene Bell, MD  amitriptyline (ELAVIL) 10 MG tablet Take 10 mg by mouth at bedtime.   Yes [provider]  fenofibrate (TRICOR) 145 MG tablet Take 1 tablet (145 mg total) by mouth daily. 01/19/14 12/04/16 Yes Zanard, Bernadene Bell, MD  meloxicam (MOBIC) 15 MG tablet Take 15 mg by mouth daily.   Yes [provider]  METAXALONE PO Take 800 mg by mouth as needed.   Yes [provider]  pantoprazole (PROTONIX) 40 MG tablet Take 1 tablet (40 mg  total) by mouth daily. 01/19/14 12/04/16 Yes Zanard, Bernadene Bell, MD  rosuvastatin (CRESTOR) 40 MG tablet Take 60 mg by mouth daily.   Yes [provider]  topiramate (TOPAMAX) 50 MG tablet Take 50 mg by mouth daily.   Yes [provider]  amoxicillin-clavulanate (AUGMENTIN) 875-125 MG tablet Take 1 tablet by mouth 2 (two) times daily. One po bid x 7 days 12/04/16   Elnora Morrison, MD  atorvastatin (LIPITOR) 40 MG tablet Take 1 tablet (40 mg total) by mouth daily. 07/25/13 07/25/14  Jonathon Resides, MD  citalopram (CELEXA) 20 MG tablet Take 20 mg by mouth daily.    [provider]  colchicine (COLCRYS) 0.6 MG tablet Take 1 tablet (0.6 mg total) by mouth 2 (two) times daily. PRN For gout attack 01/19/13 01/19/14  Jonathon Resides, MD  meloxicam (MOBIC) 7.5 MG tablet  12/21/12   [provider]  zolpidem (AMBIEN) 10 MG tablet Take 10 mg by mouth at bedtime as needed for sleep.    [provider]    Family History Family History  Problem Relation Age of Onset  . Cancer Mother        Lung Cancer  . Heart disease Father   . Diabetes Maternal Grandmother     Social History Social History  Substance Use Topics  . Smoking status: Never Smoker  . Smokeless tobacco: Former  User    Quit date: 04/05/2009  . Alcohol use Yes     Comment: 4x week     Allergies   Codeine   Review of Systems Review of Systems  Constitutional: Negative for chills and fever.  HENT: Negative for congestion.   Eyes: Negative for visual disturbance.  Respiratory: Negative for shortness of breath.   Cardiovascular: Negative for chest pain.  Gastrointestinal: Positive for abdominal pain and nausea. Negative for constipation and vomiting.  Genitourinary: Negative for dysuria and flank pain.  Musculoskeletal: Negative for back pain, neck pain and neck stiffness.  Skin: Negative for rash.  Neurological: Negative for light-headedness and headaches.     Physical Exam Updated Vital  Signs BP 134/90 (BP Location: Left Arm)   Pulse 88   Temp 99.6 F (37.6 C) (Oral)   Resp 16   Ht 5\' 10"  (1.778 m)   Wt 89.8 kg (198 lb)   SpO2 100%   BMI 28.41 kg/m   Physical Exam  Constitutional: He is oriented to person, place, and time. He appears well-developed and well-nourished.  HENT:  Head: Normocephalic and atraumatic.  Eyes: Conjunctivae are normal. Right eye exhibits no discharge. Left eye exhibits no discharge.  Neck: Normal range of motion. Neck supple. No tracheal deviation present.  Cardiovascular: Normal rate and regular rhythm.   Pulmonary/Chest: Effort normal and breath sounds normal.  Abdominal: Soft. He exhibits no distension. There is tenderness (mild left lower quadrant). There is no guarding.  Musculoskeletal: He exhibits no edema.  Neurological: He is alert and oriented to person, place, and time.  Skin: Skin is warm. No rash noted.  Psychiatric: He has a normal mood and affect.  Nursing note and vitals reviewed.    ED Treatments / Results  Labs (all labs ordered are listed, but only abnormal results are displayed) Labs Reviewed  URINALYSIS, ROUTINE W REFLEX MICROSCOPIC    EKG  EKG Interpretation None       Radiology No results found.  Procedures Procedures (including critical care time)  Medications Ordered in ED Medications  ibuprofen (ADVIL,MOTRIN) tablet 600 mg (600 mg Oral Given 12/04/16 0900)     Initial Impression / Assessment and Plan / ED Course  I have reviewed the triage vital signs and the nursing notes.  Pertinent labs & imaging results that were available during my care of the patient were reviewed by me and considered in my medical decision making (see chart for details).    Patient presents with clinical concern for diverticulitis. Patient has bloating, change in bowel movements, left lower quadrant pain and chills. The patient, his wife and I had a very long discussion regarding options for diagnosis and treatment.  We discussed blood work, CT scan versus urinalysis and treating presumptively for diverticulitis. Patient wishes to hold on CT scan and try antibiotics and will return for specific reasons we discussed. At this time I do not have concern for abscess or perforation. Patient is well appearing he sits up without discomfort, smiling in the room, no peritonitis on exam.  Results and differential diagnosis were discussed with the patient/parent/guardian. Xrays were independently reviewed by myself.  Close follow up outpatient was discussed, comfortable with the plan.   Medications  ibuprofen (ADVIL,MOTRIN) tablet 600 mg (600 mg Oral Given 12/04/16 0900)    Vitals:   12/04/16 0820  BP: 134/90  Pulse: 88  Resp: 16  Temp: 99.6 F (37.6 C)  TempSrc: Oral  SpO2: 100%  Weight: 89.8 kg (198 lb)  Height: 5\' 10"  (1.778 m)    Final diagnoses:  Diverticulitis  Intermittent left lower quadrant abdominal pain     Final Clinical Impressions(s) / ED Diagnoses   Final diagnoses:  Diverticulitis  Intermittent left lower quadrant abdominal pain    New Prescriptions New Prescriptions   AMOXICILLIN-CLAVULANATE (AUGMENTIN) 875-125 MG TABLET    Take 1 tablet by mouth 2 (two) times daily. One po bid x 7 days     Elnora Morrison, MD 12/04/16 [phone]

## 2016-12-04 NOTE — Discharge Instructions (Signed)
Return to the emergency department for persistent fever, worsening pain, persistent vomiting or new symptoms. Take antibiotics, soft diet and monitor symptoms closely.  If you were given medicines take as directed.  If you are on coumadin or contraceptives realize their levels and effectiveness is altered by many different medicines.  If you have any reaction (rash, tongues swelling, other) to the medicines stop taking and see a physician.    If your blood pressure was elevated in the ER make sure you follow up for management with a primary doctor or return for chest pain, shortness of breath or stroke symptoms.  Please follow up as directed and return to the ER or see a physician for new or worsening symptoms.  Thank you. Vitals:   12/04/16 0820  BP: 134/90  Pulse: 88  Resp: 16  Temp: 99.6 F (37.6 C)  TempSrc: Oral  SpO2: 100%  Weight: 89.8 kg (198 lb)  Height: 5\' 10"  (1.778 m)

## 2016-12-04 NOTE — ED Triage Notes (Signed)
Pt reports left side abd pain and bloating this week. States pain is over left hip. Has Hx of diverticulitis

## 2017-09-14 ENCOUNTER — Emergency Department (HOSPITAL_BASED_OUTPATIENT_CLINIC_OR_DEPARTMENT_OTHER): Payer: BLUE CROSS/BLUE SHIELD

## 2017-09-14 ENCOUNTER — Other Ambulatory Visit: Payer: Self-pay

## 2017-09-14 ENCOUNTER — Inpatient Hospital Stay (HOSPITAL_BASED_OUTPATIENT_CLINIC_OR_DEPARTMENT_OTHER)
Admission: EM | Admit: 2017-09-14 | Discharge: 2017-09-17 | DRG: 392 | Disposition: A | Discharge status: Home / Self Care | Payer: BLUE CROSS/BLUE SHIELD | Attending: Internal Medicine | Admitting: Internal Medicine

## 2017-09-14 ENCOUNTER — Encounter (HOSPITAL_BASED_OUTPATIENT_CLINIC_OR_DEPARTMENT_OTHER): Payer: Self-pay | Admitting: Emergency Medicine

## 2017-09-14 DIAGNOSIS — Z885 Allergy status to narcotic agent status: Secondary | ICD-10-CM

## 2017-09-14 DIAGNOSIS — F418 Other specified anxiety disorders: Secondary | ICD-10-CM

## 2017-09-14 DIAGNOSIS — G8929 Other chronic pain: Secondary | ICD-10-CM | POA: Diagnosis present

## 2017-09-14 DIAGNOSIS — K5732 Diverticulitis of large intestine without perforation or abscess without bleeding: Secondary | ICD-10-CM | POA: Diagnosis present

## 2017-09-14 DIAGNOSIS — Z791 Long term (current) use of non-steroidal anti-inflammatories (NSAID): Secondary | ICD-10-CM

## 2017-09-14 DIAGNOSIS — K5792 Diverticulitis of intestine, part unspecified, without perforation or abscess without bleeding: Secondary | ICD-10-CM

## 2017-09-14 DIAGNOSIS — K5720 Diverticulitis of large intestine with perforation and abscess without bleeding: Principal | ICD-10-CM | POA: Diagnosis present

## 2017-09-14 DIAGNOSIS — N181 Chronic kidney disease, stage 1: Secondary | ICD-10-CM

## 2017-09-14 DIAGNOSIS — Z79899 Other long term (current) drug therapy: Secondary | ICD-10-CM

## 2017-09-14 DIAGNOSIS — M109 Gout, unspecified: Secondary | ICD-10-CM | POA: Diagnosis present

## 2017-09-14 DIAGNOSIS — E785 Hyperlipidemia, unspecified: Secondary | ICD-10-CM | POA: Diagnosis present

## 2017-09-14 DIAGNOSIS — K219 Gastro-esophageal reflux disease without esophagitis: Secondary | ICD-10-CM | POA: Diagnosis present

## 2017-09-14 DIAGNOSIS — M549 Dorsalgia, unspecified: Secondary | ICD-10-CM | POA: Diagnosis present

## 2017-09-14 DIAGNOSIS — Z87891 Personal history of nicotine dependence: Secondary | ICD-10-CM

## 2017-09-14 DIAGNOSIS — Z85828 Personal history of other malignant neoplasm of skin: Secondary | ICD-10-CM

## 2017-09-14 DIAGNOSIS — G43909 Migraine, unspecified, not intractable, without status migrainosus: Secondary | ICD-10-CM | POA: Diagnosis present

## 2017-09-14 DIAGNOSIS — N183 Chronic kidney disease, stage 3 (moderate): Secondary | ICD-10-CM | POA: Diagnosis present

## 2017-09-14 DIAGNOSIS — K631 Perforation of intestine (nontraumatic): Secondary | ICD-10-CM

## 2017-09-14 LAB — URINALYSIS, ROUTINE W REFLEX MICROSCOPIC
BILIRUBIN URINE: NEGATIVE
Glucose, UA: NEGATIVE mg/dL
Hgb urine dipstick: NEGATIVE
Ketones, ur: NEGATIVE mg/dL
LEUKOCYTES UA: NEGATIVE
NITRITE: NEGATIVE
Protein, ur: NEGATIVE mg/dL
SPECIFIC GRAVITY, URINE: 1.015 (ref 1.005–1.030)
pH: 7 (ref 5.0–8.0)

## 2017-09-14 LAB — CBC
HCT: 37.7 % — ABNORMAL LOW (ref 39.0–52.0)
HEMOGLOBIN: 13 g/dL (ref 13.0–17.0)
MCH: 31.2 pg (ref 26.0–34.0)
MCHC: 34.5 g/dL (ref 30.0–36.0)
MCV: 90.4 fL (ref 78.0–100.0)
Platelets: 223 10*3/uL (ref 150–400)
RBC: 4.17 MIL/uL — ABNORMAL LOW (ref 4.22–5.81)
RDW: 12.6 % (ref 11.5–15.5)
WBC: 7.8 10*3/uL (ref 4.0–10.5)

## 2017-09-14 LAB — COMPREHENSIVE METABOLIC PANEL
ALBUMIN: 4.2 g/dL (ref 3.5–5.0)
ALK PHOS: 64 U/L (ref 38–126)
ALT: 38 U/L (ref 17–63)
ANION GAP: 9 (ref 5–15)
AST: 23 U/L (ref 15–41)
BILIRUBIN TOTAL: 0.4 mg/dL (ref 0.3–1.2)
BUN: 26 mg/dL — ABNORMAL HIGH (ref 6–20)
CALCIUM: 8.9 mg/dL (ref 8.9–10.3)
CO2: 24 mmol/L (ref 22–32)
Chloride: 105 mmol/L (ref 101–111)
Creatinine, Ser: 1.51 mg/dL — ABNORMAL HIGH (ref 0.61–1.24)
GFR, EST NON AFRICAN AMERICAN: 52 mL/min — AB (ref >60)
Glucose, Bld: 112 mg/dL — ABNORMAL HIGH (ref 65–99)
Potassium: 3.9 mmol/L (ref 3.5–5.1)
Sodium: 138 mmol/L (ref 135–145)
TOTAL PROTEIN: 7.2 g/dL (ref 6.5–8.1)

## 2017-09-14 LAB — LIPASE, BLOOD: Lipase: 27 U/L (ref 11–51)

## 2017-09-14 MED ORDER — IOPAMIDOL (ISOVUE-300) INJECTION 61%
100.0000 mL | Freq: Once | INTRAVENOUS | Status: AC | PRN
  Administered 2017-09-14: 100 mL via INTRAVENOUS

## 2017-09-14 MED ORDER — FENTANYL CITRATE (PF) 100 MCG/2ML IJ SOLN
50.0000 ug | Freq: Once | INTRAMUSCULAR | Status: AC
  Administered 2017-09-14: 50 ug via INTRAVENOUS
  Filled 2017-09-14: qty 2

## 2017-09-14 MED ORDER — PIPERACILLIN-TAZOBACTAM 3.375 G IVPB 30 MIN
3.3750 g | Freq: Once | INTRAVENOUS | Status: AC
  Administered 2017-09-15: 3.375 g via INTRAVENOUS
  Filled 2017-09-14 (×2): qty 50

## 2017-09-14 MED ORDER — SODIUM CHLORIDE 0.9 % IV BOLUS (SEPSIS)
1000.0000 mL | Freq: Once | INTRAVENOUS | Status: AC
  Administered 2017-09-14: 1000 mL via INTRAVENOUS

## 2017-09-14 NOTE — ED Notes (Signed)
Patient transported to CT 

## 2017-09-14 NOTE — ED Notes (Signed)
Pt was offered IV pain medication. Pt stated he would like to hold off until after CT scan. RN informed patient that if he decided he needed it to call out and RN would administer medication.

## 2017-09-14 NOTE — ED Provider Notes (Signed)
[place] EMERGENCY DEPARTMENT Provider Note   CSN: [id number] Arrival date & time: 09/14/17  1609     History   Chief Complaint Chief Complaint  Patient presents with  . Abdominal Pain    HPI Chad Gonzales is a 52 y.o. male.  Patient is a 52 year old male who presents with abdominal pain.  He has a prior history of diverticulosis.  He is status post appendectomy.  He reports a 2-week history of some worsening pain to his lower abdomen.  He states it started in his left lower abdomen but radiates across his abdomen and suprapubic area.  He states he feels pressure in his abdomen when he has a bowel movement as well as when he urinates.  He denies any nausea or vomiting.  No fevers.  No burning on urination.  He was seen by his gastroenterology group last week and was started on Cipro and Flagyl.  He states he initially got some improvement in symptoms but now it has gotten worse again.  He has not taken any other medications at home for the pain.      Past Medical History:  Diagnosis Date  . Anxiety   . Arthritis   . Basal cell carcinoma   . Depression   . Diverticulosis   . GERD (gastroesophageal reflux disease)   . Gout, unspecified   . Migraine headache   . Other and unspecified hyperlipidemia     Patient Active Problem List   Diagnosis Date Noted  . Sigmoid diverticulitis 09/15/2017  . Other and unspecified hyperlipidemia 02/26/2013  . Gout 02/26/2013  . GERD (gastroesophageal reflux disease) 02/26/2013  . Back pain 02/26/2013    Past Surgical History:  Procedure Laterality Date  . ANTERIOR CRUCIATE LIGAMENT REPAIR  04/2010   Right knee  . APPENDECTOMY    . LUMBAR MICRODISCECTOMY  L4-L5  . WISDOM TOOTH EXTRACTION         Home Medications    Prior to Admission medications   Medication Sig Start Date End Date Taking? Authorizing Provider  allopurinol (ZYLOPRIM) 300 MG tablet Take 1 tablet (300 mg total) by mouth daily. 01/19/14 12/04/16   Jonathon Resides, MD  amitriptyline (ELAVIL) 10 MG tablet Take 10 mg by mouth at bedtime.    [provider]  amoxicillin-clavulanate (AUGMENTIN) 875-125 MG tablet Take 1 tablet by mouth 2 (two) times daily. One po bid x 7 days 12/04/16   Elnora Morrison, MD  atorvastatin (LIPITOR) 40 MG tablet Take 1 tablet (40 mg total) by mouth daily. 07/25/13 07/25/14  Jonathon Resides, MD  citalopram (CELEXA) 20 MG tablet Take 20 mg by mouth daily.    [provider]  colchicine (COLCRYS) 0.6 MG tablet Take 1 tablet (0.6 mg total) by mouth 2 (two) times daily. PRN For gout attack 01/19/13 01/19/14  Jonathon Resides, MD  fenofibrate (TRICOR) 145 MG tablet Take 1 tablet (145 mg total) by mouth daily. 01/19/14 12/04/16  Jonathon Resides, MD  meloxicam (MOBIC) 15 MG tablet Take 15 mg by mouth daily.    [provider]  meloxicam (MOBIC) 7.5 MG tablet  12/21/12   [provider]  METAXALONE PO Take 800 mg by mouth as needed.    [provider]  pantoprazole (PROTONIX) 40 MG tablet Take 1 tablet (40 mg total) by mouth daily. 01/19/14 12/04/16  Jonathon Resides, MD  rosuvastatin (CRESTOR) 40 MG tablet Take 60 mg by mouth daily.    [provider]  topiramate (TOPAMAX) 50 MG tablet Take 50 mg by mouth daily.    [provider]  zolpidem (AMBIEN) 10 MG tablet Take 10 mg by mouth at bedtime as needed for sleep.    [provider]    Family History Family History  Problem Relation Age of Onset  . Cancer Mother        Lung Cancer  . Heart disease Father   . Diabetes Maternal Grandmother     Social History Social History   Tobacco Use  . Smoking status: Never Smoker  . Smokeless tobacco: Former Network engineer Use Topics  . Alcohol use: Yes    Comment: 4x week  . Drug use: No     Allergies   Codeine   Review of Systems Review of Systems  Constitutional: Negative for chills, diaphoresis, fatigue and fever.  HENT: Negative for congestion,  rhinorrhea and sneezing.   Eyes: Negative.   Respiratory: Negative for cough, chest tightness and shortness of breath.   Cardiovascular: Negative for chest pain and leg swelling.  Gastrointestinal: Positive for abdominal distention and abdominal pain. Negative for blood in stool, diarrhea, nausea and vomiting.  Genitourinary: Negative for difficulty urinating, flank pain, frequency, hematuria and testicular pain.  Musculoskeletal: Negative for arthralgias and back pain.  Skin: Negative for rash.  Neurological: Negative for dizziness, speech difficulty, weakness, numbness and headaches.     Physical Exam Updated Vital Signs BP (!) 143/85 (BP Location: Left Arm)   Pulse 83   Temp 99.5 F (37.5 C) (Oral)   Resp 16   Ht 5\' 9"  (1.753 m)   Wt 88 kg (194 lb)   SpO2 99%   BMI 28.65 kg/m   Physical Exam  Constitutional: He is oriented to person, place, and time. He appears well-developed and well-nourished.  HENT:  Head: Normocephalic and atraumatic.  Eyes: Pupils are equal, round, and reactive to light.  Neck: Normal range of motion. Neck supple.  Cardiovascular: Normal rate, regular rhythm and normal heart sounds.  Pulmonary/Chest: Effort normal and breath sounds normal. No respiratory distress. He has no wheezes. He has no rales. He exhibits no tenderness.  Abdominal: Soft. Bowel sounds are normal. There is tenderness in the right lower quadrant, suprapubic area and left lower quadrant. There is no rebound and no guarding.  Genitourinary: Testes normal. Right testis shows no tenderness. Left testis shows no tenderness.  Musculoskeletal: Normal range of motion. He exhibits no edema.  Lymphadenopathy:    He has no cervical adenopathy.  Neurological: He is alert and oriented to person, place, and time.  Skin: Skin is warm and dry. No rash noted.  Psychiatric: He has a normal mood and affect.     ED Treatments / Results  Labs (all labs ordered are listed, but only abnormal results  are displayed) Labs Reviewed  COMPREHENSIVE METABOLIC PANEL - Abnormal; Notable for the following components:      Result Value   Glucose, Bld 112 (*)    BUN 26 (*)    Creatinine, Ser 1.51 (*)    GFR calc non Af Amer 52 (*)    All other components within normal limits  CBC - Abnormal; Notable for the following components:   RBC 4.17 (*)    HCT 37.7 (*)    All other components within normal limits  LIPASE, BLOOD  URINALYSIS, ROUTINE W REFLEX MICROSCOPIC    EKG  EKG Interpretation None       Radiology Ct Abdomen Pelvis W Contrast  Result Date: 09/14/2017 CLINICAL DATA:  52 year old male with abdominal pain. Concern for diverticulitis. EXAM: CT ABDOMEN AND PELVIS WITH CONTRAST TECHNIQUE: Multidetector CT imaging of the abdomen and pelvis was performed using the standard protocol following bolus administration of intravenous contrast. CONTRAST:  161mL ISOVUE-300 IOPAMIDOL (ISOVUE-300) INJECTION 61% COMPARISON:  Abdominal CT dated 12/10/2015 FINDINGS: Lower chest: The visualized lung bases are clear. No intra-abdominal free air. Small inflammatory fluid within the pelvis. Hepatobiliary: No focal liver abnormality is seen. No gallstones, gallbladder wall thickening, or biliary dilatation. Pancreas: Unremarkable. No pancreatic ductal dilatation or surrounding inflammatory changes. Spleen: Normal in size without focal abnormality. Adrenals/Urinary Tract: Adrenal glands are unremarkable. Kidneys are normal, without renal calculi, focal lesion, or hydronephrosis. Bladder is unremarkable. Stomach/Bowel: There is sigmoid diverticulosis. There is segmental thickening and inflammatory changes of the sigmoid colon most consistent with acute diverticulitis. A small pocket of air adjacent to the sigmoid colon (series 2, image 76) appears extraluminal and concerning for a focally contained microperforation. No drainable fluid collection or evidence of diverticular abscess at this time. There is no bowel  obstruction. Appendectomy. Vascular/Lymphatic: The abdominal aorta is grossly unremarkable. Low attenuating density within the right common iliac vein likely mixing artifact. No portal venous gas. There is no adenopathy. Reproductive: The prostate and seminal vesicles are grossly unremarkable. Other: None Musculoskeletal: L4-L5 disc spacer and posterior fusion. No acute osseous pathology. IMPRESSION: Sigmoid diverticulitis with possible focally contained microperforation. No organized fluid collection or abscess at this time. Electronically Signed   By: Anner Crete M.D.   On: 09/14/2017 23:12    Procedures Procedures (including critical care time)  Medications Ordered in ED Medications  piperacillin-tazobactam (ZOSYN) IVPB 3.375 g (not administered)  fentaNYL (SUBLIMAZE) injection 50 mcg (not administered)  sodium chloride 0.9 % bolus 1,000 mL (0 mLs Intravenous Stopped 09/14/17 2101)  fentaNYL (SUBLIMAZE) injection 50 mcg (50 mcg Intravenous Given 09/14/17 2300)  iopamidol (ISOVUE-300) 61 % injection 100 mL (100 mLs Intravenous Contrast Given 09/14/17 2247)     Initial Impression / Assessment and Plan / ED Course  I have reviewed the triage vital signs and the nursing notes.  Pertinent labs & imaging results that were available during my care of the patient were reviewed by me and considered in my medical decision making (see chart for details).     Patient is a 52 year old male who presents with worsening abdominal pain after being treated for a week for presumed diverticulitis with Cipro and Flagyl.  CT scan shows positive diverticulitis with a probable microperforation.  There is no drainable abscess visualized.  His labs are non-concerning.  His creatinine is mildly elevated as compared to his prior values.  He was given IV fluids and pain medication.  I spoke with Dr. Hassell Done with general surgery who recommends medical management although the surgery team will follow along.  I spoke  with Dr. Hal Hope with the hospitalist service to admit the patient for further treatment.  Patient was given IV Zosyn in the emergency department.  Final Clinical Impressions(s) / ED Diagnoses   Final diagnoses:  Diverticulitis  Perforation bowel [facility])    ED Discharge Orders    None       Malvin Johns, MD 09/15/17 [phone]

## 2017-09-14 NOTE — ED Triage Notes (Signed)
Patient states that he went to his GI dr last week and was treated for the same pain that he is having right now. He had urine and Blood work and given RX for pain. Patient states that last night he had a flare up and he is whurting worse. Was told by GI dr come here

## 2017-09-15 ENCOUNTER — Other Ambulatory Visit: Payer: Self-pay

## 2017-09-15 DIAGNOSIS — K631 Perforation of intestine (nontraumatic): Secondary | ICD-10-CM | POA: Diagnosis not present

## 2017-09-15 DIAGNOSIS — N181 Chronic kidney disease, stage 1: Secondary | ICD-10-CM

## 2017-09-15 DIAGNOSIS — K5732 Diverticulitis of large intestine without perforation or abscess without bleeding: Secondary | ICD-10-CM | POA: Diagnosis present

## 2017-09-15 DIAGNOSIS — N183 Chronic kidney disease, stage 3 (moderate): Secondary | ICD-10-CM | POA: Diagnosis present

## 2017-09-15 DIAGNOSIS — K5720 Diverticulitis of large intestine with perforation and abscess without bleeding: Secondary | ICD-10-CM | POA: Diagnosis present

## 2017-09-15 DIAGNOSIS — Z87891 Personal history of nicotine dependence: Secondary | ICD-10-CM | POA: Diagnosis not present

## 2017-09-15 DIAGNOSIS — Z85828 Personal history of other malignant neoplasm of skin: Secondary | ICD-10-CM | POA: Diagnosis not present

## 2017-09-15 DIAGNOSIS — G43909 Migraine, unspecified, not intractable, without status migrainosus: Secondary | ICD-10-CM | POA: Diagnosis present

## 2017-09-15 DIAGNOSIS — Z79899 Other long term (current) drug therapy: Secondary | ICD-10-CM | POA: Diagnosis not present

## 2017-09-15 DIAGNOSIS — M109 Gout, unspecified: Secondary | ICD-10-CM | POA: Diagnosis present

## 2017-09-15 DIAGNOSIS — F418 Other specified anxiety disorders: Secondary | ICD-10-CM | POA: Diagnosis present

## 2017-09-15 DIAGNOSIS — M1A9XX0 Chronic gout, unspecified, without tophus (tophi): Secondary | ICD-10-CM | POA: Diagnosis not present

## 2017-09-15 DIAGNOSIS — Z791 Long term (current) use of non-steroidal anti-inflammatories (NSAID): Secondary | ICD-10-CM | POA: Diagnosis not present

## 2017-09-15 DIAGNOSIS — Z885 Allergy status to narcotic agent status: Secondary | ICD-10-CM | POA: Diagnosis not present

## 2017-09-15 DIAGNOSIS — K5792 Diverticulitis of intestine, part unspecified, without perforation or abscess without bleeding: Secondary | ICD-10-CM | POA: Diagnosis present

## 2017-09-15 DIAGNOSIS — E785 Hyperlipidemia, unspecified: Secondary | ICD-10-CM | POA: Diagnosis present

## 2017-09-15 DIAGNOSIS — M549 Dorsalgia, unspecified: Secondary | ICD-10-CM | POA: Diagnosis present

## 2017-09-15 DIAGNOSIS — K219 Gastro-esophageal reflux disease without esophagitis: Secondary | ICD-10-CM | POA: Diagnosis present

## 2017-09-15 DIAGNOSIS — G8929 Other chronic pain: Secondary | ICD-10-CM | POA: Diagnosis present

## 2017-09-15 LAB — CBC WITH DIFFERENTIAL/PLATELET
BASOS ABS: 0 10*3/uL (ref 0.0–0.1)
Basophils Relative: 0 %
Eosinophils Absolute: 0.2 10*3/uL (ref 0.0–0.7)
Eosinophils Relative: 3 %
HEMATOCRIT: 35.7 % — AB (ref 39.0–52.0)
Hemoglobin: 12.1 g/dL — ABNORMAL LOW (ref 13.0–17.0)
LYMPHS ABS: 1.3 10*3/uL (ref 0.7–4.0)
LYMPHS PCT: 21 %
MCH: 31 pg (ref 26.0–34.0)
MCHC: 33.9 g/dL (ref 30.0–36.0)
MCV: 91.5 fL (ref 78.0–100.0)
MONO ABS: 0.5 10*3/uL (ref 0.1–1.0)
MONOS PCT: 8 %
NEUTROS ABS: 4.3 10*3/uL (ref 1.7–7.7)
Neutrophils Relative %: 68 %
Platelets: 205 10*3/uL (ref 150–400)
RBC: 3.9 MIL/uL — ABNORMAL LOW (ref 4.22–5.81)
RDW: 12.9 % (ref 11.5–15.5)
WBC: 6.3 10*3/uL (ref 4.0–10.5)

## 2017-09-15 LAB — COMPREHENSIVE METABOLIC PANEL
ALBUMIN: 3.6 g/dL (ref 3.5–5.0)
ALT: 29 U/L (ref 17–63)
ANION GAP: 7 (ref 5–15)
AST: 17 U/L (ref 15–41)
Alkaline Phosphatase: 51 U/L (ref 38–126)
BUN: 20 mg/dL (ref 6–20)
CHLORIDE: 108 mmol/L (ref 101–111)
CO2: 25 mmol/L (ref 22–32)
Calcium: 8.7 mg/dL — ABNORMAL LOW (ref 8.9–10.3)
Creatinine, Ser: 1.47 mg/dL — ABNORMAL HIGH (ref 0.61–1.24)
GFR calc Af Amer: >60 mL/min (ref >60)
GFR calc non Af Amer: 54 mL/min — ABNORMAL LOW (ref >60)
GLUCOSE: 109 mg/dL — AB (ref 65–99)
POTASSIUM: 3.8 mmol/L (ref 3.5–5.1)
Sodium: 140 mmol/L (ref 135–145)
Total Bilirubin: 1 mg/dL (ref 0.3–1.2)
Total Protein: 6.4 g/dL — ABNORMAL LOW (ref 6.5–8.1)

## 2017-09-15 MED ORDER — ZOLPIDEM TARTRATE 5 MG PO TABS
5.0000 mg | ORAL_TABLET | Freq: Every evening | ORAL | Status: DC | PRN
  Administered 2017-09-15 – 2017-09-16 (×2): 5 mg via ORAL
  Filled 2017-09-15 (×2): qty 1

## 2017-09-15 MED ORDER — ENOXAPARIN SODIUM 40 MG/0.4ML ~~LOC~~ SOLN
40.0000 mg | SUBCUTANEOUS | Status: DC
  Administered 2017-09-15: 40 mg via SUBCUTANEOUS
  Filled 2017-09-15: qty 0.4

## 2017-09-15 MED ORDER — PANTOPRAZOLE SODIUM 40 MG PO TBEC: 40.0000 mg | DELAYED_RELEASE_TABLET | Freq: Every day | ORAL | Status: DC

## 2017-09-15 MED ORDER — ONDANSETRON HCL 4 MG/2ML IJ SOLN
4.0000 mg | Freq: Four times a day (QID) | INTRAMUSCULAR | Status: DC | PRN
  Administered 2017-09-17: 4 mg via INTRAVENOUS
  Filled 2017-09-15 (×2): qty 2

## 2017-09-15 MED ORDER — TOPIRAMATE 25 MG PO TABS: 150.0000 mg | ORAL_TABLET | Freq: Every day | ORAL | Status: DC

## 2017-09-15 MED ORDER — AMITRIPTYLINE HCL 25 MG PO TABS
25.0000 mg | ORAL_TABLET | Freq: Every day | ORAL | Status: DC
  Filled 2017-09-15 (×2): qty 1

## 2017-09-15 MED ORDER — PIPERACILLIN-TAZOBACTAM 3.375 G IVPB
3.3750 g | Freq: Three times a day (TID) | INTRAVENOUS | Status: DC
  Administered 2017-09-15 – 2017-09-17 (×7): 3.375 g via INTRAVENOUS
  Filled 2017-09-15 (×7): qty 50

## 2017-09-15 MED ORDER — HYDROMORPHONE HCL 1 MG/ML IJ SOLN
1.0000 mg | INTRAMUSCULAR | Status: DC | PRN
  Administered 2017-09-15 (×2): 1 mg via INTRAVENOUS
  Filled 2017-09-15 (×3): qty 1

## 2017-09-15 MED ORDER — FENTANYL CITRATE (PF) 100 MCG/2ML IJ SOLN
50.0000 ug | Freq: Once | INTRAMUSCULAR | Status: AC
Start: 2017-09-15 — End: 2017-09-15
  Administered 2017-09-15: 50 ug via INTRAVENOUS
  Filled 2017-09-15: qty 2

## 2017-09-15 MED ORDER — DEXTROSE-NACL 5-0.9 % IV SOLN: INTRAVENOUS | Status: AC

## 2017-09-15 MED ORDER — FENTANYL CITRATE (PF) 100 MCG/2ML IJ SOLN
50.0000 ug | Freq: Once | INTRAMUSCULAR | Status: AC
  Administered 2017-09-15: 50 ug via INTRAVENOUS
  Filled 2017-09-15: qty 2

## 2017-09-15 MED ORDER — MORPHINE SULFATE (PF) 2 MG/ML IV SOLN: 1.0000 mg | INTRAVENOUS | Status: DC | PRN

## 2017-09-15 MED ORDER — FENTANYL CITRATE (PF) 100 MCG/2ML IJ SOLN
25.0000 ug | INTRAMUSCULAR | Status: DC | PRN
  Administered 2017-09-15: 25 ug via INTRAVENOUS
  Filled 2017-09-15: qty 2

## 2017-09-15 NOTE — Progress Notes (Signed)
Report received from [facility] ED. Pt received to room 1307 via stretcher and ambulated to bed. Call bell within reach of patient. Pt in NAD at this time.

## 2017-09-15 NOTE — ED Notes (Signed)
Attempted to call report. Was informed of a 30 minute time line

## 2017-09-15 NOTE — Consult Note (Signed)
Reason for Consult:abdominal pain Referring Physician: Dr. Clarene Essex Chad Gonzales is an 52 y.o. male.  HPI: The patient is a 52 year old white male who presents with left lower quadrant abdominal pain.  The pain is apparently been going on for the last week or so.  He went to his medical doctor who suspected he had diverticulitis and put him on Cipro and Flagyl.  During the week the pain worsened.  He denies any fevers or chills.  He denied any nausea or vomiting.  He went to the emergency department where a CT scan showed sigmoid diverticulitis with a small dot of extraluminal air consistent with a possible microperforation.  His white count and temperature are normal.  Past Medical History:  Diagnosis Date  . Anxiety   . Arthritis   . Basal cell carcinoma   . Depression   . Diverticulosis   . GERD (gastroesophageal reflux disease)   . Gout, unspecified   . Migraine headache   . Other and unspecified hyperlipidemia     Past Surgical History:  Procedure Laterality Date  . ANTERIOR CRUCIATE LIGAMENT REPAIR  04/2010   Right knee  . APPENDECTOMY    . LUMBAR MICRODISCECTOMY  L4-L5  . WISDOM TOOTH EXTRACTION      Family History  Problem Relation Age of Onset  . Cancer Mother        Lung Cancer  . Heart disease Father   . Diabetes Maternal Grandmother     Social History:  reports that  has never smoked. He quit smokeless tobacco use about 8 years ago. He reports that he drinks alcohol. He reports that he does not use drugs.  Allergies:  Allergies  Allergen Reactions  . Codeine Nausea Only    Medications: I have reviewed the patient's current medications.  Results for orders placed or performed during the hospital encounter of 09/14/17 (from the past 48 hour(s))  Urinalysis, Routine w reflex microscopic     Status: None   Collection Time: 09/14/17  5:04 PM  Result Value Ref Range   Color, Urine YELLOW YELLOW   APPearance CLEAR CLEAR   Specific Gravity, Urine 1.015  1.005 - 1.030   pH 7.0 5.0 - 8.0   Glucose, UA NEGATIVE NEGATIVE mg/dL   Hgb urine dipstick NEGATIVE NEGATIVE   Bilirubin Urine NEGATIVE NEGATIVE   Ketones, ur NEGATIVE NEGATIVE mg/dL   Protein, ur NEGATIVE NEGATIVE mg/dL   Nitrite NEGATIVE NEGATIVE   Leukocytes, UA NEGATIVE NEGATIVE    Comment: Microscopic not done on urines with negative protein, blood, leukocytes, nitrite, or glucose < 500 mg/dL. Performed at [facility], [address] [place], [place] [address]   Lipase, blood     Status: None   Collection Time: 09/14/17  5:50 PM  Result Value Ref Range   Lipase 27 11 - 51 U/L    Comment: Performed at [facility], [address] [place], [place] [address]  Comprehensive metabolic panel     Status: Abnormal   Collection Time: 09/14/17  5:50 PM  Result Value Ref Range   Sodium 138 135 - 145 mmol/L   Potassium 3.9 3.5 - 5.1 mmol/L   Chloride 105 101 - 111 mmol/L   CO2 24 22 - 32 mmol/L   Glucose, Bld 112 (H) 65 - 99 mg/dL   BUN 26 (H) 6 - 20 mg/dL   Creatinine, Ser 1.51 (H) 0.61 - 1.24 mg/dL   Calcium 8.9 8.9 - 10.3 mg/dL  Total Protein 7.2 6.5 - 8.1 g/dL   Albumin 4.2 3.5 - 5.0 g/dL   AST 23 15 - 41 U/L   ALT 38 17 - 63 U/L   Alkaline Phosphatase 64 38 - 126 U/L   Total Bilirubin 0.4 0.3 - 1.2 mg/dL   GFR calc non Af Amer 52 (L) >60 mL/min   GFR calc Af Amer >60 >60 mL/min    Comment: (NOTE) The eGFR has been calculated using the CKD EPI equation. This calculation has not been validated in all clinical situations. eGFR's persistently <60 mL/min signify possible Chronic Kidney Disease.    Anion gap 9 5 - 15    Comment: Performed at [facility], [address] [place], [place] [address]  CBC     Status: Abnormal   Collection Time: 09/14/17  5:50 PM  Result Value Ref Range   WBC 7.8 4.0 - 10.5 K/uL   RBC 4.17 (L) 4.22 - 5.81 MIL/uL   Hemoglobin 13.0 13.0 - 17.0 g/dL   HCT 37.7 (L) 39.0 - 52.0 %   MCV 90.4 78.0 - 100.0 fL    MCH 31.2 26.0 - 34.0 pg   MCHC 34.5 30.0 - 36.0 g/dL   RDW 12.6 11.5 - 15.5 %   Platelets 223 150 - 400 K/uL    Comment: Performed at [facility], [address] [place], [place] [address]  Comprehensive metabolic panel     Status: Abnormal   Collection Time: 09/15/17  5:49 AM  Result Value Ref Range   Sodium 140 135 - 145 mmol/L   Potassium 3.8 3.5 - 5.1 mmol/L   Chloride 108 101 - 111 mmol/L   CO2 25 22 - 32 mmol/L   Glucose, Bld 109 (H) 65 - 99 mg/dL   BUN 20 6 - 20 mg/dL   Creatinine, Ser 1.47 (H) 0.61 - 1.24 mg/dL   Calcium 8.7 (L) 8.9 - 10.3 mg/dL   Total Protein 6.4 (L) 6.5 - 8.1 g/dL   Albumin 3.6 3.5 - 5.0 g/dL   AST 17 15 - 41 U/L   ALT 29 17 - 63 U/L   Alkaline Phosphatase 51 38 - 126 U/L   Total Bilirubin 1.0 0.3 - 1.2 mg/dL   GFR calc non Af Amer 54 (L) >60 mL/min   GFR calc Af Amer >60 >60 mL/min    Comment: (NOTE) The eGFR has been calculated using the CKD EPI equation. This calculation has not been validated in all clinical situations. eGFR's persistently <60 mL/min signify possible Chronic Kidney Disease.    Anion gap 7 5 - 15    Comment: Performed at [facility], [place] [address], [place], [place] [address]  CBC with Differential/Platelet     Status: Abnormal   Collection Time: 09/15/17  5:49 AM  Result Value Ref Range   WBC 6.3 4.0 - 10.5 K/uL   RBC 3.90 (L) 4.22 - 5.81 MIL/uL   Hemoglobin 12.1 (L) 13.0 - 17.0 g/dL   HCT 35.7 (L) 39.0 - 52.0 %   MCV 91.5 78.0 - 100.0 fL   MCH 31.0 26.0 - 34.0 pg   MCHC 33.9 30.0 - 36.0 g/dL   RDW 12.9 11.5 - 15.5 %   Platelets 205 150 - 400 K/uL   Neutrophils Relative % 68 %   Neutro Abs 4.3 1.7 - 7.7 K/uL   Lymphocytes Relative 21 %   Lymphs Abs 1.3 0.7 - 4.0 K/uL   Monocytes  Relative 8 %   Monocytes Absolute 0.5 0.1 - 1.0 K/uL   Eosinophils Relative 3 %   Eosinophils Absolute 0.2 0.0 - 0.7 K/uL   Basophils Relative 0 %   Basophils Absolute 0.0 0.0 - 0.1 K/uL    Comment:  Performed at [facility], [place] [address], [place], [place] [address]    Ct Abdomen Pelvis W Contrast  Result Date: 09/14/2017 CLINICAL DATA:  52 year old male with abdominal pain. Concern for diverticulitis. EXAM: CT ABDOMEN AND PELVIS WITH CONTRAST TECHNIQUE: Multidetector CT imaging of the abdomen and pelvis was performed using the standard protocol following bolus administration of intravenous contrast. CONTRAST:  167m ISOVUE-300 IOPAMIDOL (ISOVUE-300) INJECTION 61% COMPARISON:  Abdominal CT dated 12/10/2015 FINDINGS: Lower chest: The visualized lung bases are clear. No intra-abdominal free air. Small inflammatory fluid within the pelvis. Hepatobiliary: No focal liver abnormality is seen. No gallstones, gallbladder wall thickening, or biliary dilatation. Pancreas: Unremarkable. No pancreatic ductal dilatation or surrounding inflammatory changes. Spleen: Normal in size without focal abnormality. Adrenals/Urinary Tract: Adrenal glands are unremarkable. Kidneys are normal, without renal calculi, focal lesion, or hydronephrosis. Bladder is unremarkable. Stomach/Bowel: There is sigmoid diverticulosis. There is segmental thickening and inflammatory changes of the sigmoid colon most consistent with acute diverticulitis. A small pocket of air adjacent to the sigmoid colon (series 2, image 76) appears extraluminal and concerning for a focally contained microperforation. No drainable fluid collection or evidence of diverticular abscess at this time. There is no bowel obstruction. Appendectomy. Vascular/Lymphatic: The abdominal aorta is grossly unremarkable. Low attenuating density within the right common iliac vein likely mixing artifact. No portal venous gas. There is no adenopathy. Reproductive: The prostate and seminal vesicles are grossly unremarkable. Other: None Musculoskeletal: L4-L5 disc spacer and posterior fusion. No acute osseous pathology. IMPRESSION: Sigmoid diverticulitis with  possible focally contained microperforation. No organized fluid collection or abscess at this time. Electronically Signed   By: AAnner CreteM.D.   On: 09/14/2017 23:12    Review of Systems  Constitutional: Negative.   HENT: Negative.   Eyes: Negative.   Respiratory: Negative.   Cardiovascular: Negative.   Gastrointestinal: Positive for abdominal pain.  Genitourinary: Negative.   Musculoskeletal: Negative.   Skin: Negative.   Neurological: Negative.   Endo/Heme/Allergies: Negative.   Psychiatric/Behavioral: Negative.    Blood pressure 137/82, pulse 73, temperature 98.1 F (36.7 C), temperature source Oral, resp. rate 16, height '5\' 9"'  (1.753 m), weight 88 kg (194 lb), SpO2 98 %. Physical Exam  Constitutional: He is oriented to person, place, and time. He appears well-developed and well-nourished. No distress.  HENT:  Head: Normocephalic and atraumatic.  Mouth/Throat: No oropharyngeal exudate.  Eyes: Conjunctivae and EOM are normal. Pupils are equal, round, and reactive to light.  Neck: Normal range of motion. Neck supple.  Cardiovascular: Normal rate, regular rhythm and normal heart sounds.  Respiratory: Effort normal and breath sounds normal. No stridor. No respiratory distress.  GI: Soft. Bowel sounds are normal.  There is mild to moderate tenderness LLQ but no guarding or peritontis  Musculoskeletal: Normal range of motion. He exhibits no edema or tenderness.  Neurological: He is alert and oriented to person, place, and time. Coordination normal.  Skin: Skin is warm and dry. No rash noted.  Psychiatric: He has a normal mood and affect. His behavior is normal. Thought content normal.    Assessment/Plan: The patient appears to have a sigmoid diverticulitis with some evidence of microperforation.  His abdomen at this point is quite soft  with minimal tenderness.  I would recommend treating him with bowel rest and IV antibiotics.  He is on Zosyn and I think this is a good  choice.  Once his pain improves he could slowly start advancing his diet.  I think it is likely that he will get over this acute episode with conservative management.  I have then talked to him about whether he should do diet modification and observation versus elective surgery in 6 to 8 weeks for removal of the offending segment of colon.  He is undecided about how he would like to proceed longer-term.  We will continue to follow him closely with you  TOTH III,Aziyah Provencal S 09/15/2017, 10:40 AM

## 2017-09-15 NOTE — Progress Notes (Signed)
Pharmacy Antibiotic Note  Chad Gonzales is a 52 y.o. male admitted on 09/14/2017 with IAI.  Pharmacy has been consulted for Zosyn dosing.  Plan: Zosyn 3.375g IV q8h (4 hour infusion). Will sign off  Height: 5\' 9"  (175.3 cm) Weight: 194 lb (88 kg) IBW/kg (Calculated) : 70.7  Temp (24hrs), Avg:98.7 F (37.1 C), Min:98.1 F (36.7 C), Max:99.5 F (37.5 C)  Recent Labs  Lab 09/14/17 1750 09/15/17 0549  WBC 7.8 6.3  CREATININE 1.51* 1.47*    Estimated Creatinine Clearance: 65.3 mL/min (A) (by C-G formula based on SCr of 1.47 mg/dL (H)).    Allergies  Allergen Reactions  . Codeine Nausea Only    Thank you for allowing pharmacy to be a part of this patient's care.  Adrian Saran, PharmD, BCPS Pager [phone] 09/15/2017 8:37 AM

## 2017-09-15 NOTE — H&P (Signed)
History and Physical    Chad Gonzales XIP:[id number] DOB: [birth year] DOA: 09/14/2017  PCP: Jonathon Resides, MD  Patient coming from: Home.   I have personally briefly reviewed patient's old medical records in [place]  Chief Complaint: lower quadrant abdominal pain since 5 days.   HPI: Chad Gonzales is a 52 y.o. male with medical history significant of diverticulosis, GERD, gout, depression and anxiety, hyperlipidemia, presented to [facility] with worsening lower quadrant abdominal pain started almost 5 days ago, associated with nausea, minimal vomiting and diarrhea. He denies any fever or chills, sob, chest pain, cough, syncope, headache, sensory deficits or generalized weakness. He was seen on the 8th and was given oral antibiotics, but pt reports he had persistent symptoms and came to the [facility] for further evaluation.  ED Course: labs in ED, revealed, normal wbc count, hemoglobin of 12.1 and creatinine of 1.47. CT abd and pelvis shows sigmoid diverticulitis with focal contained microperforation. He was referred to [facility] for acute diverticulitis.   Review of Systems: "All others reviewed and are negative,"   Past Medical History:  Diagnosis Date  . Anxiety   . Arthritis   . Basal cell carcinoma   . Depression   . Diverticulosis   . GERD (gastroesophageal reflux disease)   . Gout, unspecified   . Migraine headache   . Other and unspecified hyperlipidemia     Past Surgical History:  Procedure Laterality Date  . ANTERIOR CRUCIATE LIGAMENT REPAIR  04/2010   Right knee  . APPENDECTOMY    . LUMBAR MICRODISCECTOMY  L4-L5  . WISDOM TOOTH EXTRACTION       reports that  has never smoked. He quit smokeless tobacco use about 8 years ago. He reports that he drinks alcohol. He reports that he does not use drugs.  Allergies  Allergen Reactions  . Codeine Nausea Only    Family History  Problem Relation Age of Onset  . Cancer Mother        Lung Cancer  . Heart disease  Father   . Diabetes Maternal Grandmother     Family history reviewed and not pertinent   Prior to Admission medications   Medication Sig Start Date End Date Taking? Authorizing Provider  amitriptyline (ELAVIL) 25 MG tablet Take 25 mg by mouth at bedtime.   Yes [provider]  ciprofloxacin (CIPRO) 500 MG tablet Take 500 mg by mouth 2 (two) times daily.   Yes [provider]  dicyclomine (BENTYL) 10 MG capsule Take 10 mg by mouth 4 (four) times daily.   Yes [provider]  DULoxetine (CYMBALTA) 30 MG capsule Take 30 mg by mouth daily.   Yes [provider]  fenofibrate 160 MG tablet Take 160 mg by mouth daily.   Yes [provider]  meloxicam (MOBIC) 15 MG tablet Take 15 mg by mouth daily.   Yes [provider]  metaxalone (SKELAXIN) 800 MG tablet Take 800 mg by mouth 3 (three) times daily as needed for muscle spasms.   Yes [provider]  metroNIDAZOLE (FLAGYL) 500 MG tablet Take 500 mg by mouth 2 (two) times daily.   Yes [provider]  pantoprazole (PROTONIX) 40 MG tablet Take 1 tablet (40 mg total) by mouth daily. 01/19/14 09/15/17 Yes Zanard, Bernadene Bell, MD  rosuvastatin (CRESTOR) 40 MG tablet Take 20-40 mg by mouth daily.    Yes [provider]  topiramate (TOPAMAX) 50 MG tablet Take 150 mg by mouth daily.  Yes [provider]  allopurinol (ZYLOPRIM) 300 MG tablet Take 1 tablet (300 mg total) by mouth daily. Patient not taking: Reported on 09/15/2017 01/19/14 12/04/16  Jonathon Resides, MD  amoxicillin-clavulanate (AUGMENTIN) 875-125 MG tablet Take 1 tablet by mouth 2 (two) times daily. One po bid x 7 days Patient not taking: Reported on 09/15/2017 12/04/16   Elnora Morrison, MD  atorvastatin (LIPITOR) 40 MG tablet Take 1 tablet (40 mg total) by mouth daily. Patient not taking: Reported on 09/15/2017 07/25/13 07/25/14  Jonathon Resides, MD  colchicine (COLCRYS) 0.6 MG tablet Take 1 tablet (0.6 mg total) by  mouth 2 (two) times daily. PRN For gout attack Patient not taking: Reported on 09/15/2017 01/19/13 01/19/14  Jonathon Resides, MD  fenofibrate (TRICOR) 145 MG tablet Take 1 tablet (145 mg total) by mouth daily. Patient not taking: Reported on 09/15/2017 01/19/14 12/04/16  Jonathon Resides, MD    Physical Exam: Vitals:   09/15/17 0009 09/15/17 0304 09/15/17 0349 09/15/17 0358  BP: 132/87 129/88 137/82   Pulse: 78 70 73   Resp:  16 16   Temp:  98.6 F (37 C) 98.1 F (36.7 C)   TempSrc:   Oral   SpO2: 100% 100% 98%   Weight:    88 kg (194 lb)  Height:    5\' 9"  (1.753 m)    Constitutional: NAD, calm, comfortable Vitals:   09/15/17 0009 09/15/17 0304 09/15/17 0349 09/15/17 0358  BP: 132/87 129/88 137/82   Pulse: 78 70 73   Resp:  16 16   Temp:  98.6 F (37 C) 98.1 F (36.7 C)   TempSrc:   Oral   SpO2: 100% 100% 98%   Weight:    88 kg (194 lb)  Height:    5\' 9"  (1.753 m)   Eyes: PERRL, lids and conjunctivae normal ENMT: Mucous membranes are moist. Posterior pharynx clear of any exudate or lesions.Normal dentition.  Neck: normal, supple, no masses, no thyromegaly Respiratory: clear to auscultation bilaterally, no wheezing, no crackles. Normal respiratory effort. No accessory muscle use.  Cardiovascular: Regular rate and rhythm, no murmurs / rubs / gallops. No extremity edema. 2+ pedal pulses. No carotid bruits.  Abdomen: soft tender in the lower quadrant and non distended, bowel sounds good.  Musculoskeletal: no clubbing / cyanosis. No joint deformity upper and lower extremities. Good ROM, no contractures. Normal muscle tone.  Skin: no rashes, lesions, ulcers. No induration Neurologic: CN 2-12 grossly intact. Sensation intact, DTR normal. Strength 5/5 in all 4.  Psychiatric: Normal judgment and insight. Alert and oriented x 3. Normal mood.    Labs on Admission: I have personally reviewed following labs and imaging studies  CBC: Recent Labs  Lab 09/14/17 1750 09/15/17 0549  WBC  7.8 6.3  NEUTROABS  --  4.3  HGB 13.0 12.1*  HCT 37.7* 35.7*  MCV 90.4 91.5  PLT 223 696   Basic Metabolic Panel: Recent Labs  Lab 09/14/17 1750 09/15/17 0549  NA 138 140  K 3.9 3.8  CL 105 108  CO2 24 25  GLUCOSE 112* 109*  BUN 26* 20  CREATININE 1.51* 1.47*  CALCIUM 8.9 8.7*   GFR: Estimated Creatinine Clearance: 65.3 mL/min (A) (by C-G formula based on SCr of 1.47 mg/dL (H)). Liver Function Tests: Recent Labs  Lab 09/14/17 1750 09/15/17 0549  AST 23 17  ALT 38 29  ALKPHOS 64 51  BILITOT 0.4 1.0  PROT 7.2 6.4*  ALBUMIN 4.2 3.6  Recent Labs  Lab 09/14/17 1750  LIPASE 27   No results for input(s): AMMONIA in the last 168 hours. Coagulation Profile: No results for input(s): INR, PROTIME in the last 168 hours. Cardiac Enzymes: No results for input(s): CKTOTAL, CKMB, CKMBINDEX, TROPONINI in the last 168 hours. BNP (last 3 results) No results for input(s): PROBNP in the last 8760 hours. HbA1C: No results for input(s): HGBA1C in the last 72 hours. CBG: No results for input(s): GLUCAP in the last 168 hours. Lipid Profile: No results for input(s): CHOL, HDL, LDLCALC, TRIG, CHOLHDL, LDLDIRECT in the last 72 hours. Thyroid Function Tests: No results for input(s): TSH, T4TOTAL, FREET4, T3FREE, THYROIDAB in the last 72 hours. Anemia Panel: No results for input(s): VITAMINB12, FOLATE, FERRITIN, TIBC, IRON, RETICCTPCT in the last 72 hours. Urine analysis:    Component Value Date/Time   COLORURINE YELLOW 09/14/2017 1704   APPEARANCEUR CLEAR 09/14/2017 1704   LABSPEC 1.015 09/14/2017 1704   PHURINE 7.0 09/14/2017 1704   GLUCOSEU NEGATIVE 09/14/2017 1704   HGBUR NEGATIVE 09/14/2017 1704   BILIRUBINUR NEGATIVE 09/14/2017 1704   KETONESUR NEGATIVE 09/14/2017 1704   PROTEINUR NEGATIVE 09/14/2017 1704   NITRITE NEGATIVE 09/14/2017 1704   LEUKOCYTESUR NEGATIVE 09/14/2017 1704    Radiological Exams on Admission: Ct Abdomen Pelvis W Contrast  Result Date:  09/14/2017 CLINICAL DATA:  52 year old male with abdominal pain. Concern for diverticulitis. EXAM: CT ABDOMEN AND PELVIS WITH CONTRAST TECHNIQUE: Multidetector CT imaging of the abdomen and pelvis was performed using the standard protocol following bolus administration of intravenous contrast. CONTRAST:  160mL ISOVUE-300 IOPAMIDOL (ISOVUE-300) INJECTION 61% COMPARISON:  Abdominal CT dated 12/10/2015 FINDINGS: Lower chest: The visualized lung bases are clear. No intra-abdominal free air. Small inflammatory fluid within the pelvis. Hepatobiliary: No focal liver abnormality is seen. No gallstones, gallbladder wall thickening, or biliary dilatation. Pancreas: Unremarkable. No pancreatic ductal dilatation or surrounding inflammatory changes. Spleen: Normal in size without focal abnormality. Adrenals/Urinary Tract: Adrenal glands are unremarkable. Kidneys are normal, without renal calculi, focal lesion, or hydronephrosis. Bladder is unremarkable. Stomach/Bowel: There is sigmoid diverticulosis. There is segmental thickening and inflammatory changes of the sigmoid colon most consistent with acute diverticulitis. A small pocket of air adjacent to the sigmoid colon (series 2, image 76) appears extraluminal and concerning for a focally contained microperforation. No drainable fluid collection or evidence of diverticular abscess at this time. There is no bowel obstruction. Appendectomy. Vascular/Lymphatic: The abdominal aorta is grossly unremarkable. Low attenuating density within the right common iliac vein likely mixing artifact. No portal venous gas. There is no adenopathy. Reproductive: The prostate and seminal vesicles are grossly unremarkable. Other: None Musculoskeletal: L4-L5 disc spacer and posterior fusion. No acute osseous pathology. IMPRESSION: Sigmoid diverticulitis with possible focally contained microperforation. No organized fluid collection or abscess at this time. Electronically Signed   By: Anner Crete  M.D.   On: 09/14/2017 23:12    EKG: not done.   Assessment/Plan Active Problems:   Sigmoid diverticulitis   Diverticulitis     Acute sigmoid diverticulitis with focal contained microperforation:  Admit for IV antibiotics, NPO, IV fluids, IV pain control, IV anti emetics.  Surgery consulted for recommendations.    Anxiety with depression:  Stable. No signs of suicidal ideations.    Hyperlipidemia:  Outpatient follow up with PCP. Resume statin.    GERD: Resume protonix.    Stage 1 CKD:  Baseline creatinine around 1.5. Currently 1.47.  Stable.      DVT prophylaxis: lovenox.  Code Status: full code.  Family Communication:none at bedside.  Disposition Plan: pending resolution of pain and diarrhea.  Consults called: surgery.  Admission status: inpatient/ medsurg.   Hosie Poisson MD [facility] Pager [phone]  If 7PM-7AM, please contact night-coverage [email or web address] Password [facility]  09/15/2017, 8:24 AM

## 2017-09-15 NOTE — ED Notes (Addendum)
Chad Gonzales [phone] Spouse.

## 2017-09-16 DIAGNOSIS — N183 Chronic kidney disease, stage 3 (moderate): Secondary | ICD-10-CM

## 2017-09-16 DIAGNOSIS — M1A9XX0 Chronic gout, unspecified, without tophus (tophi): Secondary | ICD-10-CM

## 2017-09-16 DIAGNOSIS — K631 Perforation of intestine (nontraumatic): Secondary | ICD-10-CM

## 2017-09-16 DIAGNOSIS — F418 Other specified anxiety disorders: Secondary | ICD-10-CM

## 2017-09-16 DIAGNOSIS — K5732 Diverticulitis of large intestine without perforation or abscess without bleeding: Secondary | ICD-10-CM

## 2017-09-16 DIAGNOSIS — K219 Gastro-esophageal reflux disease without esophagitis: Secondary | ICD-10-CM

## 2017-09-16 LAB — HIV ANTIBODY (ROUTINE TESTING W REFLEX): HIV Screen 4th Generation wRfx: NONREACTIVE

## 2017-09-16 MED ORDER — ROSUVASTATIN CALCIUM 20 MG PO TABS: 20.0000 mg | ORAL_TABLET | Freq: Every day | ORAL | Status: DC

## 2017-09-16 MED ORDER — OXYCODONE HCL 5 MG PO TABS: 5.0000 mg | ORAL_TABLET | ORAL | Status: DC | PRN

## 2017-09-16 MED ORDER — KCL IN DEXTROSE-NACL 20-5-0.9 MEQ/L-%-% IV SOLN
INTRAVENOUS | Status: DC
  Filled 2017-09-16: qty 1000

## 2017-09-16 MED ORDER — DICYCLOMINE HCL 10 MG PO CAPS
10.0000 mg | ORAL_CAPSULE | Freq: Four times a day (QID) | ORAL | Status: DC | PRN
  Filled 2017-09-16: qty 1

## 2017-09-16 MED ORDER — KCL IN DEXTROSE-NACL 20-5-0.9 MEQ/L-%-% IV SOLN
INTRAVENOUS | Status: DC
  Administered 2017-09-16: 15:00:00 via INTRAVENOUS
  Filled 2017-09-16 (×2): qty 1000

## 2017-09-16 MED ORDER — ALLOPURINOL 300 MG PO TABS: 300.0000 mg | ORAL_TABLET | Freq: Every day | ORAL | Status: DC

## 2017-09-16 MED ORDER — ACETAMINOPHEN 500 MG PO TABS
1000.0000 mg | ORAL_TABLET | Freq: Three times a day (TID) | ORAL | Status: DC
  Administered 2017-09-16: 1000 mg via ORAL
  Filled 2017-09-16 (×3): qty 2

## 2017-09-16 MED ORDER — METAXALONE 800 MG PO TABS
800.0000 mg | ORAL_TABLET | Freq: Every evening | ORAL | Status: DC | PRN
  Filled 2017-09-16: qty 1

## 2017-09-16 MED ORDER — METAXALONE 800 MG PO TABS
800.0000 mg | ORAL_TABLET | Freq: Three times a day (TID) | ORAL | Status: DC | PRN
  Filled 2017-09-16: qty 1

## 2017-09-16 NOTE — Progress Notes (Addendum)
CC:  LLQ abdominal pain  Subjective: Still having some pain but better, he says at home he would just tolerate it but taking some stuff here.  Dilaudid makes him loopy.  He failed home abx course before this admit.  Objective: Vital signs in last 24 hours: Temp:  [97.5 F (36.4 C)-98.2 F (36.8 C)] 97.5 F (36.4 C) (03/14 1047) Pulse Rate:  [64-74] 67 (03/14 1047) Resp:  [16] 16 (03/14 1047) BP: (129-138)/(68-82) 130/68 (03/14 1047) SpO2:  [100 %] 100 % (03/14 1047) Last BM Date: 09/15/17 NPO No fluids listed; 100 ml recorded Urine 775 Stool none Afebrile, VSS No labs this AM  Intake/Output from previous day: 03/13 0701 - 03/14 0700 In: 100 [IV Piggyback:100] Out: 775 [Urine:775] Intake/Output this shift: No intake/output data recorded.  General appearance: alert, cooperative and no distress GI: some pain LLQ, but better  Lab Results:  Recent Labs    09/14/17 1750 09/15/17 0549  WBC 7.8 6.3  HGB 13.0 12.1*  HCT 37.7* 35.7*  PLT 223 205    BMET Recent Labs    09/14/17 1750 09/15/17 0549  NA 138 140  K 3.9 3.8  CL 105 108  CO2 24 25  GLUCOSE 112* 109*  BUN 26* 20  CREATININE 1.51* 1.47*  CALCIUM 8.9 8.7*   PT/INR No results for input(s): LABPROT, INR in the last 72 hours.  Recent Labs  Lab 09/14/17 1750 09/15/17 0549  AST 23 17  ALT 38 29  ALKPHOS 64 51  BILITOT 0.4 1.0  PROT 7.2 6.4*  ALBUMIN 4.2 3.6     Lipase     Component Value Date/Time   LIPASE 27 09/14/2017 1750     Prior to Admission medications   Medication Sig Start Date End Date Taking? Authorizing Provider  amitriptyline (ELAVIL) 25 MG tablet Take 25 mg by mouth at bedtime.   Yes [provider]  ciprofloxacin (CIPRO) 500 MG tablet Take 500 mg by mouth 2 (two) times daily.   Yes [provider]  dicyclomine (BENTYL) 10 MG capsule Take 10 mg by mouth 4 (four) times daily.   Yes [provider]  DULoxetine (CYMBALTA) 30 MG capsule Take 30 mg  by mouth daily.   Yes [provider]  fenofibrate 160 MG tablet Take 160 mg by mouth daily.   Yes [provider]  meloxicam (MOBIC) 15 MG tablet Take 15 mg by mouth daily.   Yes [provider]  metaxalone (SKELAXIN) 800 MG tablet Take 800 mg by mouth 3 (three) times daily as needed for muscle spasms.   Yes [provider]  metroNIDAZOLE (FLAGYL) 500 MG tablet Take 500 mg by mouth 2 (two) times daily.   Yes [provider]  pantoprazole (PROTONIX) 40 MG tablet Take 1 tablet (40 mg total) by mouth daily. 01/19/14 09/15/17 Yes Zanard, Bernadene Bell, MD  rosuvastatin (CRESTOR) 40 MG tablet Take 20-40 mg by mouth daily.    Yes [provider]  topiramate (TOPAMAX) 50 MG tablet Take 150 mg by mouth daily.    Yes [provider]  allopurinol (ZYLOPRIM) 300 MG tablet Take 1 tablet (300 mg total) by mouth daily. Patient not taking: Reported on 09/15/2017 01/19/14 12/04/16  Jonathon Resides, MD  amoxicillin-clavulanate (AUGMENTIN) 875-125 MG tablet Take 1 tablet by mouth 2 (two) times daily. One po bid x 7 days Patient not taking: Reported on 09/15/2017 12/04/16   Elnora Morrison, MD  atorvastatin (LIPITOR) 40 MG tablet Take 1  tablet (40 mg total) by mouth daily. Patient not taking: Reported on 09/15/2017 07/25/13 07/25/14  Jonathon Resides, MD  colchicine (COLCRYS) 0.6 MG tablet Take 1 tablet (0.6 mg total) by mouth 2 (two) times daily. PRN For gout attack Patient not taking: Reported on 09/15/2017 01/19/13 01/19/14  Jonathon Resides, MD  fenofibrate (TRICOR) 145 MG tablet Take 1 tablet (145 mg total) by mouth daily. Patient not taking: Reported on 09/15/2017 01/19/14 12/04/16  Jonathon Resides, MD    Medications: . allopurinol  300 mg Oral QHS  . amitriptyline  25 mg Oral QHS  . enoxaparin (LOVENOX) injection  40 mg Subcutaneous Q24H  . pantoprazole  40 mg Oral Daily  . topiramate  150 mg Oral Daily   . piperacillin-tazobactam (ZOSYN)  IV 3.375 g (09/16/17 1037)    Anti-infectives (From admission, onward)   Start     Dose/Rate Route Frequency Ordered Stop   09/15/17 1000  piperacillin-tazobactam (ZOSYN) IVPB 3.375 g     3.375 g 12.5 mL/hr over 240 Minutes Intravenous Every 8 hours 09/15/17 0838     09/15/17 0000  piperacillin-tazobactam (ZOSYN) IVPB 3.375 g     3.375 g 100 mL/hr over 30 Minutes Intravenous  Once 09/14/17 2349 09/15/17 0050     Assessment/Plan Depression/anxiety Gout Migraine Dyslipidemia GERD Mild rena insuffiencey - creatinine 1.51,  1.47  Sigmoid diverticulitis with microperforation  - IV abx, IV fluids, start PO clears/pain med  FEN:  IV fluids/NPO ID:  Zosyn 3/12 =>> day 3 DVT:  lovenox  Foley:  none Follow up:  TBD   Plan:  Clear liquids, continue IV abx, PO pain meds, IV fluids for now.  Recheck labs in AM.    LOS: 1 day    Arleatha Philipps 09/16/2017 [phone]

## 2017-09-16 NOTE — Progress Notes (Signed)
PROGRESS NOTE    Chad Gonzales   VEH:[id number]  DOB: 04/24/1966  DOA: 09/14/2017 PCP: Jonathon Resides, MD   Brief Narrative:  Chad Gonzales is a 52 y.o. male with medical history significant of diverticulosis, GERD, gout, depression and anxiety, hyperlipidemia, presented to [facility] with worsening lower abdominal pain with nausea and vomiting. He has had some loose stools. He has been on Cipro and Flagyl as prescribed by his PCP but has acutely worsened over the past few days.   Subjective: Abdominal pain has improved. He is a little nauseated but has had no vomiting.  ROS: no complaints of nausea, vomiting, constipation diarrhea, cough, dyspnea or dysuria. No other complaints.   Assessment & Plan:   Principal Problem:   Sigmoid diverticulitis - with microperforation - appears to be doing better today in regards to pain - clear liquids, PRN Zofran- cont IVF - failed Cipro and Flagyl- cont Zosyn- gen surgery following  Active Problems:   Gout - allopurinol    GERD (gastroesophageal reflux disease) - cont PPI    Depression with anxiety - Topamax  Chronic back pain - Elavil, Skelaxin PRN  CKD3 - stable when compared to prior labs in Epic  DVT prophylaxis: Lovenox Code Status: Full code Family Communication: wife Disposition Plan: home in 1-2 days Consultants:   gen surgery Procedures:   none Antimicrobials:  Anti-infectives (From admission, onward)   Start     Dose/Rate Route Frequency Ordered Stop   09/15/17 1000  piperacillin-tazobactam (ZOSYN) IVPB 3.375 g     3.375 g 12.5 mL/hr over 240 Minutes Intravenous Every 8 hours 09/15/17 0838     09/15/17 0000  piperacillin-tazobactam (ZOSYN) IVPB 3.375 g     3.375 g 100 mL/hr over 30 Minutes Intravenous  Once 09/14/17 2349 09/15/17 0050       Objective: Vitals:   09/15/17 1313 09/15/17 2322 09/16/17 1047 09/16/17 1343  BP: 129/79 138/82 130/68 132/81  Pulse: 64 74 67 71  Resp: 16 16 16 15   Temp:  (!) 97.5 F (36.4 C) 98.2 F (36.8 C) (!) 97.5 F (36.4 C) 98.7 F (37.1 C)  TempSrc: Oral Oral Oral Oral  SpO2: 100% 100% 100% 100%  Weight:      Height:        Intake/Output Summary (Last 24 hours) at 09/16/2017 1357 Last data filed at 09/16/2017 1344 Gross per 24 hour  Intake 50 ml  Output 750 ml  Net -700 ml   Filed Weights   09/14/17 1612 09/14/17 1639 09/15/17 0358  Weight: 88 kg (194 lb) 88 kg (194 lb) 88 kg (194 lb)    Examination: General exam: Appears comfortable  HEENT: PERRLA, oral mucosa moist, no sclera icterus or thrush Respiratory system: Clear to auscultation. Respiratory effort normal. Cardiovascular system: S1 & S2 heard, RRR.  No murmurs  Gastrointestinal system: Abdomen soft,  Tender across suprapubic area and LLQ, nondistended. Normal bowel sound. No organomegaly Central nervous system: Alert and oriented. No focal neurological deficits. Extremities: No cyanosis, clubbing or edema Skin: No rashes or ulcers Psychiatry:  Mood & affect appropriate.     Data Reviewed: I have personally reviewed following labs and imaging studies  CBC: Recent Labs  Lab 09/14/17 1750 09/15/17 0549  WBC 7.8 6.3  NEUTROABS  --  4.3  HGB 13.0 12.1*  HCT 37.7* 35.7*  MCV 90.4 91.5  PLT 223 836   Basic Metabolic Panel: Recent Labs  Lab 09/14/17 1750 09/15/17 0549  NA 138 140  K 3.9 3.8  CL 105 108  CO2 24 25  GLUCOSE 112* 109*  BUN 26* 20  CREATININE 1.51* 1.47*  CALCIUM 8.9 8.7*   GFR: Estimated Creatinine Clearance: 65.3 mL/min (A) (by C-G formula based on SCr of 1.47 mg/dL (H)). Liver Function Tests: Recent Labs  Lab 09/14/17 1750 09/15/17 0549  AST 23 17  ALT 38 29  ALKPHOS 64 51  BILITOT 0.4 1.0  PROT 7.2 6.4*  ALBUMIN 4.2 3.6   Recent Labs  Lab 09/14/17 1750  LIPASE 27   No results for input(s): AMMONIA in the last 168 hours. Coagulation Profile: No results for input(s): INR, PROTIME in the last 168 hours. Cardiac Enzymes: No  results for input(s): CKTOTAL, CKMB, CKMBINDEX, TROPONINI in the last 168 hours. BNP (last 3 results) No results for input(s): PROBNP in the last 8760 hours. HbA1C: No results for input(s): HGBA1C in the last 72 hours. CBG: No results for input(s): GLUCAP in the last 168 hours. Lipid Profile: No results for input(s): CHOL, HDL, LDLCALC, TRIG, CHOLHDL, LDLDIRECT in the last 72 hours. Thyroid Function Tests: No results for input(s): TSH, T4TOTAL, FREET4, T3FREE, THYROIDAB in the last 72 hours. Anemia Panel: No results for input(s): VITAMINB12, FOLATE, FERRITIN, TIBC, IRON, RETICCTPCT in the last 72 hours. Urine analysis:    Component Value Date/Time   COLORURINE YELLOW 09/14/2017 1704   APPEARANCEUR CLEAR 09/14/2017 1704   LABSPEC 1.015 09/14/2017 1704   PHURINE 7.0 09/14/2017 1704   GLUCOSEU NEGATIVE 09/14/2017 1704   HGBUR NEGATIVE 09/14/2017 1704   BILIRUBINUR NEGATIVE 09/14/2017 1704   KETONESUR NEGATIVE 09/14/2017 1704   PROTEINUR NEGATIVE 09/14/2017 1704   NITRITE NEGATIVE 09/14/2017 1704   LEUKOCYTESUR NEGATIVE 09/14/2017 1704   Sepsis Labs: @LABRCNTIP (procalcitonin:4,lacticidven:4) )No results found for this or any previous visit (from the past 240 hour(s)).       Radiology Studies: Ct Abdomen Pelvis W Contrast  Result Date: 09/14/2017 CLINICAL DATA:  52 year old male with abdominal pain. Concern for diverticulitis. EXAM: CT ABDOMEN AND PELVIS WITH CONTRAST TECHNIQUE: Multidetector CT imaging of the abdomen and pelvis was performed using the standard protocol following bolus administration of intravenous contrast. CONTRAST:  127mL ISOVUE-300 IOPAMIDOL (ISOVUE-300) INJECTION 61% COMPARISON:  Abdominal CT dated 12/10/2015 FINDINGS: Lower chest: The visualized lung bases are clear. No intra-abdominal free air. Small inflammatory fluid within the pelvis. Hepatobiliary: No focal liver abnormality is seen. No gallstones, gallbladder wall thickening, or biliary dilatation.  Pancreas: Unremarkable. No pancreatic ductal dilatation or surrounding inflammatory changes. Spleen: Normal in size without focal abnormality. Adrenals/Urinary Tract: Adrenal glands are unremarkable. Kidneys are normal, without renal calculi, focal lesion, or hydronephrosis. Bladder is unremarkable. Stomach/Bowel: There is sigmoid diverticulosis. There is segmental thickening and inflammatory changes of the sigmoid colon most consistent with acute diverticulitis. A small pocket of air adjacent to the sigmoid colon (series 2, image 76) appears extraluminal and concerning for a focally contained microperforation. No drainable fluid collection or evidence of diverticular abscess at this time. There is no bowel obstruction. Appendectomy. Vascular/Lymphatic: The abdominal aorta is grossly unremarkable. Low attenuating density within the right common iliac vein likely mixing artifact. No portal venous gas. There is no adenopathy. Reproductive: The prostate and seminal vesicles are grossly unremarkable. Other: None Musculoskeletal: L4-L5 disc spacer and posterior fusion. No acute osseous pathology. IMPRESSION: Sigmoid diverticulitis with possible focally contained microperforation. No organized fluid collection or abscess at this time. Electronically Signed   By: Anner Crete M.D.   On: 09/14/2017 23:12  Scheduled Meds: . acetaminophen  1,000 mg Oral Q8H  . allopurinol  300 mg Oral QHS  . amitriptyline  25 mg Oral QHS  . enoxaparin (LOVENOX) injection  40 mg Subcutaneous Q24H  . pantoprazole  40 mg Oral Daily  . topiramate  150 mg Oral Daily   Continuous Infusions: . dextrose 5 % and 0.9 % NaCl with KCl 20 mEq/L    . piperacillin-tazobactam (ZOSYN)  IV 3.375 g (09/16/17 1037)     LOS: 1 day    Time spent in minutes: [place], MD [facility] Pager: [email or web address] Password [facility] 09/16/2017, 1:57 PM

## 2017-09-17 LAB — CBC
HCT: 37.1 % — ABNORMAL LOW (ref 39.0–52.0)
Hemoglobin: 12.6 g/dL — ABNORMAL LOW (ref 13.0–17.0)
MCH: 30.1 pg (ref 26.0–34.0)
MCHC: 34 g/dL (ref 30.0–36.0)
MCV: 88.8 fL (ref 78.0–100.0)
PLATELETS: 254 10*3/uL (ref 150–400)
RBC: 4.18 MIL/uL — ABNORMAL LOW (ref 4.22–5.81)
RDW: 12.5 % (ref 11.5–15.5)
WBC: 5.2 10*3/uL (ref 4.0–10.5)

## 2017-09-17 LAB — BASIC METABOLIC PANEL
Anion gap: 8 (ref 5–15)
BUN: 13 mg/dL (ref 6–20)
CALCIUM: 9 mg/dL (ref 8.9–10.3)
CO2: 25 mmol/L (ref 22–32)
CREATININE: 1.25 mg/dL — AB (ref 0.61–1.24)
Chloride: 108 mmol/L (ref 101–111)
GFR calc Af Amer: >60 mL/min (ref >60)
GLUCOSE: 107 mg/dL — AB (ref 65–99)
Potassium: 4 mmol/L (ref 3.5–5.1)
SODIUM: 141 mmol/L (ref 135–145)

## 2017-09-17 MED ORDER — AMOXICILLIN-POT CLAVULANATE 875-125 MG PO TABS
1.0000 | ORAL_TABLET | Freq: Two times a day (BID) | ORAL | Status: DC
  Administered 2017-09-17: 1 via ORAL
  Filled 2017-09-17: qty 1

## 2017-09-17 MED ORDER — ONDANSETRON HCL 4 MG PO TABS: 4.0000 mg | ORAL_TABLET | Freq: Three times a day (TID) | ORAL | 0 refills | Status: AC | PRN

## 2017-09-17 MED ORDER — AMOXICILLIN-POT CLAVULANATE 875-125 MG PO TABS: 1.0000 | ORAL_TABLET | Freq: Two times a day (BID) | ORAL | 0 refills | Status: DC

## 2017-09-17 NOTE — Discharge Instructions (Signed)
Low-Fiber Diet ( for about 2 wks) Fiber is found in fruits, vegetables, and whole grains. A low-fiber diet restricts fibrous foods that are not digested in the small intestine. A diet containing about 10-15 grams of fiber per day is considered low fiber. Low-fiber diets may be used to:  Promote healing and rest the bowel during intestinal flare-ups.  Prevent blockage of a partially obstructed or narrowed gastrointestinal tract.  Reduce fecal weight and volume.  Slow the movement of feces.  You may be on a low-fiber diet as a transitional diet following surgery, after an injury (trauma), or because of a short (acute) or lifelong (chronic) illness. Your health care provider will determine the length of time you need to stay on this diet. What do I need to know about a low-fiber diet? Always check the fiber content on the packaging's Nutrition Facts label, especially on foods from the grains list. Ask your dietitian if you have questions about specific foods that are related to your condition, especially if the food is not listed below. In general, a low-fiber food will have less than 2 g of fiber. What foods can I eat? Grains All breads and crackers made with white flour. Sweet rolls, doughnuts, waffles, pancakes, [place] toast, bagels. Pretzels, Melba toast, zwieback. Well-cooked cereals, such as cornmeal, farina, or cream cereals. Dry cereals that do not contain whole grains, fruit, or nuts, such as refined corn, wheat, rice, and oat cereals. Potatoes prepared any way without skins, plain pastas and noodles, refined white rice. Use white flour for baking and making sauces. Use allowed list of grains for casseroles, dumplings, and puddings. Vegetables Strained tomato and vegetable juices. Fresh lettuce, cucumber, spinach. Well-cooked (no skin or pulp) or canned vegetables, such as asparagus, bean sprouts, beets, carrots, green beans, mushrooms, potatoes, pumpkin, spinach, yellow squash, tomato  sauce/puree, turnips, yams, and zucchini. Keep servings limited to  cup. Fruits All fruit juices except prune juice. Cooked or canned fruits without skin and seeds, such as applesauce, apricots, cherries, fruit cocktail, grapefruit, grapes, mandarin oranges, melons, peaches, pears, pineapple, and plums. Fresh fruits without skin, such as apricots, avocados, bananas, melons, pineapple, nectarines, and peaches. Keep servings limited to  cup or 1 piece. Meat and Other Protein Sources Ground or well-cooked tender beef, ham, veal, lamb, pork, or poultry. Eggs, plain cheese. Fish, oysters, shrimp, lobster, and other seafood. Liver, organ meats. Smooth nut butters. Dairy All milk products and alternative dairy substitutes, such as soy, rice, almond, and coconut, not containing added whole nuts, seeds, or added fruit. Beverages Decaf coffee, fruit, and vegetable juices or smoothies (small amounts, with no pulp or skins, and with fruits from allowed list), sports drinks, herbal tea. Condiments Ketchup, mustard, vinegar, cream sauce, cheese sauce, cocoa powder. Spices in moderation, such as allspice, basil, bay leaves, celery powder or leaves, cinnamon, cumin powder, curry powder, ginger, mace, marjoram, onion or garlic powder, oregano, paprika, parsley flakes, ground pepper, rosemary, sage, savory, tarragon, thyme, and turmeric. Sweets and Desserts Plain cakes and cookies, pie made with allowed fruit, pudding, custard, cream pie. Gelatin, fruit, ice, sherbet, frozen ice pops. Ice cream, ice milk without nuts. Plain hard candy, honey, jelly, molasses, syrup, sugar, chocolate syrup, gumdrops, marshmallows. Limit overall sugar intake. Fats and Oil Margarine, butter, cream, mayonnaise, salad oils, plain salad dressings made from allowed foods. Choose healthy fats such as olive oil, canola oil, and omega-3 fatty acids (such as found in salmon or tuna) when possible. Other Bouillon, broth, or cream soups made  from allowed foods. Any strained soup. Casseroles or mixed dishes made with allowed foods. The items listed above may not be a complete list of recommended foods or beverages. Contact your dietitian for more options. What foods are not recommended? Grains All whole wheat and whole grain breads and crackers. Multigrains, rye, bran seeds, nuts, or coconut. Cereals containing whole grains, multigrains, bran, coconut, nuts, raisins. Cooked or dry oatmeal, steel-cut oats. Coarse wheat cereals, granola. Cereals advertised as high fiber. Potato skins. Whole grain pasta, wild or brown rice. Popcorn. Coconut flour. Bran, buckwheat, corn bread, multigrains, rye, wheat germ. Vegetables Fresh, cooked or canned vegetables, such as artichokes, asparagus, beet greens, broccoli, Brussels sprouts, cabbage, celery, cauliflower, corn, eggplant, kale, legumes or beans, okra, peas, and tomatoes. Avoid large servings of any vegetables, especially raw vegetables. Fruits Fresh fruits, such as apples with or without skin, berries, cherries, figs, grapes, grapefruit, guavas, kiwis, mangoes, oranges, papayas, pears, persimmons, pineapple, and pomegranate. Prune juice and juices with pulp, stewed or dried prunes. Dried fruits, dates, raisins. Fruit seeds or skins. Avoid large servings of all fresh fruits. Meats and Other Protein Sources Tough, fibrous meats with gristle. Chunky nut butter. Cheese made with seeds, nuts, or other foods not recommended. Nuts, seeds, legumes (beans, including baked beans), dried peas, beans, lentils. Dairy Yogurt or cheese that contains nuts, seeds, or added fruit. Beverages Fruit juices with high pulp, prune juice. Caffeinated coffee and teas. Condiments Coconut, maple syrup, pickles, olives. Sweets and Desserts Desserts, cookies, or candies that contain nuts or coconut, chunky peanut butter, dried fruits. Jams, preserves with seeds, marmalade. Large amounts of sugar and sweets. Any other  dessert made with fruits from the not recommended list. Other Soups made from vegetables that are not recommended or that contain other foods not recommended. The items listed above may not be a complete list of foods and beverages to avoid. Contact your dietitian for more information. This information is not intended to replace advice given to you by your health care provider. Make sure you discuss any questions you have with your health care provider. Document Released: 12/12/2001 Document Revised: 11/28/2015 Document Reviewed: 05/15/2013 Elsevier Interactive Patient Education  2017 [employer]   Diverticulitis Diverticulitis is infection or inflammation of small pouches (diverticula) in the colon that form due to a condition called diverticulosis. Diverticula can trap stool (feces) and bacteria, causing infection and inflammation. Diverticulitis may cause severe stomach pain and diarrhea. It may lead to tissue damage in the colon that causes bleeding. The diverticula may also burst (rupture) and cause infected stool to enter other areas of the abdomen. Complications of diverticulitis can include:  Bleeding.  Severe infection.  Severe pain.  Rupture (perforation) of the colon.  Blockage (obstruction) of the colon.  What are the causes? This condition is caused by stool becoming trapped in the diverticula, which allows bacteria to grow in the diverticula. This leads to inflammation and infection. What increases the risk? You are more likely to develop this condition if:  You have diverticulosis. The risk for diverticulosis increases if: ? You are overweight or obese. ? You use tobacco products. ? You do not get enough exercise.  You eat a diet that does not include enough fiber. High-fiber foods include fruits, vegetables, beans, nuts, and whole grains.  What are the signs or symptoms? Symptoms of this condition may include:  Pain and tenderness in the abdomen. The pain is  normally located on the left side of the abdomen, but it may occur  in other areas.  Fever and chills.  Bloating.  Cramping.  Nausea.  Vomiting.  Changes in bowel routines.  Blood in your stool.  How is this diagnosed? This condition is diagnosed based on:  Your medical history.  A physical exam.  Tests to make sure there is nothing else causing your condition. These tests may include: ? Blood tests. ? Urine tests. ? Imaging tests of the abdomen, including X-rays, ultrasounds, MRIs, or CT scans.  How is this treated? Most cases of this condition are mild and can be treated at home. Treatment may include:  Taking over-the-counter pain medicines.  Following a clear liquid diet.  Taking antibiotic medicines by mouth.  Rest.  More severe cases may need to be treated at a hospital. Treatment may include:  Not eating or drinking.  Taking prescription pain medicine.  Receiving antibiotic medicines through an IV tube.  Receiving fluids and nutrition through an IV tube.  Surgery.  When your condition is under control, your health care provider may recommend that you have a colonoscopy. This is an exam to look at the entire large intestine. During the exam, a lubricated, bendable tube is inserted into the anus and then passed into the rectum, colon, and other parts of the large intestine. A colonoscopy can show how severe your diverticula are and whether something else may be causing your symptoms. Follow these instructions at home: Medicines  Take over-the-counter and prescription medicines only as told by your health care provider. These include fiber supplements, probiotics, and stool softeners.  If you were prescribed an antibiotic medicine, take it as told by your health care provider. Do not stop taking the antibiotic even if you start to feel better.  Do not drive or use heavy machinery while taking prescription pain medicine. General instructions  Follow a  full liquid diet or another diet as directed by your health care provider. After your symptoms improve, your health care provider may tell you to change your diet. He or she may recommend that you eat a diet that contains at least 25 g (25 grams) of fiber daily. Fiber makes it easier to pass stool. Healthy sources of fiber include: ? Berries. One cup contains 4-8 grams of fiber. ? Beans or lentils. One half cup contains 5-8 grams of fiber. ? Green vegetables. One cup contains 4 grams of fiber.  Exercise for at least 30 minutes, 3 times each week. You should exercise hard enough to raise your heart rate and break a sweat.  Keep all follow-up visits as told by your health care provider. This is important. You may need a colonoscopy. Contact a health care provider if:  Your pain does not improve.  You have a hard time drinking or eating food.  Your bowel movements do not return to normal. Get help right away if:  Your pain gets worse.  Your symptoms do not get better with treatment.  Your symptoms suddenly get worse.  You have a fever.  You vomit more than one time.  You have stools that are bloody, black, or tarry. Summary  Diverticulitis is infection or inflammation of small pouches (diverticula) in the colon that form due to a condition called diverticulosis. Diverticula can trap stool (feces) and bacteria, causing infection and inflammation.  You are at higher risk for this condition if you have diverticulosis and you eat a diet that does not include enough fiber.  Most cases of this condition are mild and can be treated at home. More  severe cases may need to be treated at a hospital.  When your condition is under control, your health care provider may recommend that you have an exam called a colonoscopy. This exam can show how severe your diverticula are and whether something else may be causing your symptoms. This information is not intended to replace advice given to you by  your health care provider. Make sure you discuss any questions you have with your health care provider. Document Released: 04/01/2005 Document Revised: 07/25/2016 Document Reviewed: 07/25/2016 Elsevier Interactive Patient Education  2018 [place] for the next 2 weeks A soft-food meal plan includes foods that are safe and easy to swallow. This meal plan typically is used:  If you are having trouble chewing or swallowing foods.  As a transition meal plan after only having had liquid meals for a long period.  What do I need to know about the soft-food meal plan? A soft-food meal plan includes tender foods that are soft and easy to chew and swallow. In most cases, bite-sized pieces of food are easier to swallow. A bite-sized piece is about  inch or smaller. Foods in this plan do not need to be ground or pureed. Foods that are very hard, crunchy, or sticky should be avoided. Also, breads, cereals, yogurts, and desserts with nuts, seeds, or fruits should be avoided. What foods can I eat? Grains Rice and wild rice. Moist bread, dressing, pasta, and noodles. Well-moistened dry or cooked cereals, such as farina (cooked wheat cereal), oatmeal, or grits. Biscuits, breads, muffins, pancakes, and waffles that have been well moistened. Vegetables Shredded lettuce. Cooked, tender vegetables, including potatoes without skins. Vegetable juices. Broths or creamed soups made with vegetables that are not stringy or chewy. Strained tomatoes (without seeds). Fruits Canned or well-cooked fruits. Soft (ripe), peeled fresh fruits, such as peaches, nectarines, kiwi, cantaloupe, honeydew melon, and watermelon (without seeds). Soft berries with small seeds, such as strawberries. Fruit juices (without pulp). Meats and Other Protein Sources Moist, tender, lean beef. Mutton. Lamb. Veal. Chicken. [place]. Liver. Ham. Fish without bones. Eggs. Dairy Milk, milk drinks, and cream. Plain cream cheese  and cottage cheese. Plain yogurt. Sweets/Desserts Flavored gelatin desserts. Custard. Plain ice cream, frozen yogurt, sherbet, milk shakes, and malts. Plain cakes and cookies. Plain hard candy. Other Butter, margarine (without trans fat), and cooking oils. Mayonnaise. Cream sauces. Mild spices, salt, and sugar. Syrup, molasses, honey, and jelly. The items listed above may not be a complete list of recommended foods or beverages. Contact your dietitian for more options. What foods are not recommended? Grains Dry bread, toast, crackers that have not been moistened. Coarse or dry cereals, such as bran, granola, and shredded wheat. Tough or chewy crusty breads, such as [place] bread or baguettes. Vegetables Corn. Raw vegetables except shredded lettuce. Cooked vegetables that are tough or stringy. Tough, crisp, fried potatoes and potato skins. Fruits Fresh fruits with skins or seeds or both, such as apples, pears, or grapes. Stringy, high-pulp fruits, such as papaya, pineapple, coconut, or mango. Fruit leather, fruit roll-ups, and all dried fruits. Meats and Other Protein Sources Sausages and hot dogs. Meats with gristle. Fish with bones. Nuts, seeds, and chunky peanut or other nut butters. Sweets/Desserts Cakes or cookies that are very dry or chewy. The items listed above may not be a complete list of foods and beverages to avoid. Contact your dietitian for more information. This information is not intended to replace advice given to you by your health  care provider. Make sure you discuss any questions you have with your health care provider. Document Released: 09/29/2007 Document Revised: 11/28/2015 Document Reviewed: 05/19/2013 Elsevier Interactive Patient Education  2017 [place].   High-Fiber Diet start this in about 2 weeks  Fiber, also called dietary fiber, is a type of carbohydrate found in fruits, vegetables, whole grains, and beans. A high-fiber diet can have many health benefits.  Your health care provider may recommend a high-fiber diet to help:  Prevent constipation. Fiber can make your bowel movements more regular.  Lower your cholesterol.  Relieve hemorrhoids, uncomplicated diverticulosis, or irritable bowel syndrome.  Prevent overeating as part of a weight-loss plan.  Prevent heart disease, type 2 diabetes, and certain cancers.  What is my plan? The recommended daily intake of fiber includes:  38 grams for men under age 106.  59 grams for men over age 22.  33 grams for women under age 33.  2 grams for women over age 14.  You can get the recommended daily intake of dietary fiber by eating a variety of fruits, vegetables, grains, and beans. Your health care provider may also recommend a fiber supplement if it is not possible to get enough fiber through your diet. What do I need to know about a high-fiber diet?  Fiber supplements have not been widely studied for their effectiveness, so it is better to get fiber through food sources.  Always check the fiber content on thenutrition facts label of any prepackaged food. Look for foods that contain at least 5 grams of fiber per serving.  Ask your dietitian if you have questions about specific foods that are related to your condition, especially if those foods are not listed in the following section.  Increase your daily fiber consumption gradually. Increasing your intake of dietary fiber too quickly may cause bloating, cramping, or gas.  Drink plenty of water. Water helps you to digest fiber. What foods can I eat? Grains Whole-grain breads. Multigrain cereal. Oats and oatmeal. Brown rice. Barley. Bulgur wheat. [place]. Bran muffins. Popcorn. Rye wafer crackers. Vegetables Sweet potatoes. Spinach. Kale. Artichokes. Cabbage. Broccoli. Green peas. Carrots. Squash. Fruits Berries. Pears. Apples. Oranges. Avocados. Prunes and raisins. Dried figs. Meats and Other Protein Sources [place], kidney, pinto, and soy  beans. Split peas. Lentils. Nuts and seeds. Dairy Fiber-fortified yogurt. Beverages Fiber-fortified soy milk. Fiber-fortified orange juice. Other Fiber bars. The items listed above may not be a complete list of recommended foods or beverages. Contact your dietitian for more options. What foods are not recommended? Grains White bread. Pasta made with refined flour. White rice. Vegetables Fried potatoes. Canned vegetables. Well-cooked vegetables. Fruits Fruit juice. Cooked, strained fruit. Meats and Other Protein Sources Fatty cuts of meat. Fried Sales executive or fried fish. Dairy Milk. Yogurt. Cream cheese. Sour cream. Beverages Soft drinks. Other Cakes and pastries. Butter and oils. The items listed above may not be a complete list of foods and beverages to avoid. Contact your dietitian for more information. What are some tips for including high-fiber foods in my diet?  Eat a wide variety of high-fiber foods.  Make sure that half of all grains consumed each day are whole grains.  Replace breads and cereals made from refined flour or white flour with whole-grain breads and cereals.  Replace white rice with brown rice, bulgur wheat, or millet.  Start the day with a breakfast that is high in fiber, such as a cereal that contains at least 5 grams of fiber per serving.  Use beans  in place of meat in soups, salads, or pasta.  Eat high-fiber snacks, such as berries, raw vegetables, nuts, or popcorn. This information is not intended to replace advice given to you by your health care provider. Make sure you discuss any questions you have with your health care provider. Document Released: 06/22/2005 Document Revised: 11/28/2015 Document Reviewed: 12/05/2013 Elsevier Interactive Patient Education  [employer].

## 2017-09-17 NOTE — Discharge Summary (Addendum)
Physician Discharge Summary  Chad Gonzales WLN:[id number] DOB: [birth year] DOA: 09/14/2017  PCP: Jonathon Resides, MD  Admit date: 09/14/2017 Discharge date: 09/17/2017  Admitted From: home Disposition:  home   Recommendations for Outpatient Follow-up:  1. gen surgery recommends f/u in 2 wks    Discharge Condition:  stable   CODE STATUS:  Full code   Consultations:  General surgery    Discharge Diagnoses:  Principal Problem:   Sigmoid diverticulitis with microperforation Active Problems:   Gout   GERD (gastroesophageal reflux disease)   Depression with anxiety  Subjective: Had some nausea and vomiting this AM on an empty stomach. Subsequently has had solid food and has done well without GI issues.   Brief Summary: Chad Gonzales is a 52 y.o.malewith medical history significant ofdiverticulosis, GERD, gout, depression and anxiety, hyperlipidemia, presented to [facility] with worsening lower abdominal pain with nausea and vomiting. He has had some loose stools. He has been on Cipro and Flagyl as prescribed by his PCP but has acutely worsened over the past few days.   Hospital Course:  Principal Problem:   Sigmoid diverticulitis - with microperforation - pain resolved yesterday and has not recurred  - advanced to soft diet which he has tolerated well today- advised low fiber for at least 2 wks and then slowly re-introduce fiber - failed Cipro and Flagyl as outpt -has been receiving Zosyn in the hospital and was transitioned to Augmentin today - gen surgery following and recommending total 14 days of antibiotics- d/w patient - Zofran prescribed in case he has nausea  Active Problems:   Gout - allopurinol    GERD (gastroesophageal reflux disease) - cont PPI    Depression with anxiety - Topamax  Chronic back pain - Elavil, Skelaxin and Mobic PRN  CKD 2-3 - stable when compared to prior labs in Epic  Discharge Exam: Vitals:   09/17/17 0511 09/17/17 1344   BP: 135/85 (!) 125/59  Pulse: 63 (!) 58  Resp: 15 16  Temp: 97.8 F (36.6 C) 98 F (36.7 C)  SpO2: 100% 100%   Vitals:   09/16/17 1343 09/16/17 2031 09/17/17 0511 09/17/17 1344  BP: 132/81 139/79 135/85 (!) 125/59  Pulse: 71 69 63 (!) 58  Resp: 15 14 15 16   Temp: 98.7 F (37.1 C) 98.8 F (37.1 C) 97.8 F (36.6 C) 98 F (36.7 C)  TempSrc: Oral Oral Oral Oral  SpO2: 100% 100% 100% 100%  Weight:      Height:        General: Pt is alert, awake, not in acute distress Cardiovascular: RRR, S1/S2 +, no rubs, no gallops Respiratory: CTA bilaterally, no wheezing, no rhonchi Abdominal: Soft, NT, ND, bowel sounds + Extremities: no edema, no cyanosis   Discharge Instructions  Discharge Instructions    Diet - low sodium heart healthy   Complete by:  As directed    Increase activity slowly   Complete by:  As directed      Allergies as of 09/17/2017      Reactions   Codeine Nausea Only      Medication List    STOP taking these medications   ciprofloxacin 500 MG tablet Commonly known as:  CIPRO   metroNIDAZOLE 500 MG tablet Commonly known as:  FLAGYL     TAKE these medications   allopurinol 300 MG tablet Commonly known as:  ZYLOPRIM Take 1 tablet (300 mg total) by mouth daily.   amitriptyline 25 MG tablet Commonly known as:  ELAVIL Take 25 mg by mouth at bedtime.   amoxicillin-clavulanate 875-125 MG tablet Commonly known as:  AUGMENTIN Take 1 tablet by mouth 2 (two) times daily. One po bid x 7 days   atorvastatin 40 MG tablet Commonly known as:  LIPITOR Take 1 tablet (40 mg total) by mouth daily.   colchicine 0.6 MG tablet Commonly known as:  COLCRYS Take 1 tablet (0.6 mg total) by mouth 2 (two) times daily. PRN For gout attack   dicyclomine 10 MG capsule Commonly known as:  BENTYL Take 10 mg by mouth 4 (four) times daily.   DULoxetine 30 MG capsule Commonly known as:  CYMBALTA Take 30 mg by mouth daily.   fenofibrate 160 MG tablet Take 160 mg by  mouth daily. What changed:  Another medication with the same name was removed. Continue taking this medication, and follow the directions you see here.   meloxicam 15 MG tablet Commonly known as:  MOBIC Take 15 mg by mouth daily.   metaxalone 800 MG tablet Commonly known as:  SKELAXIN Take 800 mg by mouth 3 (three) times daily as needed for muscle spasms.   pantoprazole 40 MG tablet Commonly known as:  PROTONIX Take 1 tablet (40 mg total) by mouth daily.   rosuvastatin 40 MG tablet Commonly known as:  CRESTOR Take 20-40 mg by mouth daily.   topiramate 50 MG tablet Commonly known as:  TOPAMAX Take 150 mg by mouth daily.      Follow-up Information    Jovita Kussmaul, MD Follow up.   Specialty:  General Surgery Why:  call for an appointment in 2 weeks. Contact information: [address] [place] [address] [phone]          Allergies  Allergen Reactions  . Codeine Nausea Only     Procedures/Studies:    Ct Abdomen Pelvis W Contrast  Result Date: 09/14/2017 CLINICAL DATA:  52 year old male with abdominal pain. Concern for diverticulitis. EXAM: CT ABDOMEN AND PELVIS WITH CONTRAST TECHNIQUE: Multidetector CT imaging of the abdomen and pelvis was performed using the standard protocol following bolus administration of intravenous contrast. CONTRAST:  149mL ISOVUE-300 IOPAMIDOL (ISOVUE-300) INJECTION 61% COMPARISON:  Abdominal CT dated 12/10/2015 FINDINGS: Lower chest: The visualized lung bases are clear. No intra-abdominal free air. Small inflammatory fluid within the pelvis. Hepatobiliary: No focal liver abnormality is seen. No gallstones, gallbladder wall thickening, or biliary dilatation. Pancreas: Unremarkable. No pancreatic ductal dilatation or surrounding inflammatory changes. Spleen: Normal in size without focal abnormality. Adrenals/Urinary Tract: Adrenal glands are unremarkable. Kidneys are normal, without renal calculi, focal lesion, or  hydronephrosis. Bladder is unremarkable. Stomach/Bowel: There is sigmoid diverticulosis. There is segmental thickening and inflammatory changes of the sigmoid colon most consistent with acute diverticulitis. A small pocket of air adjacent to the sigmoid colon (series 2, image 76) appears extraluminal and concerning for a focally contained microperforation. No drainable fluid collection or evidence of diverticular abscess at this time. There is no bowel obstruction. Appendectomy. Vascular/Lymphatic: The abdominal aorta is grossly unremarkable. Low attenuating density within the right common iliac vein likely mixing artifact. No portal venous gas. There is no adenopathy. Reproductive: The prostate and seminal vesicles are grossly unremarkable. Other: None Musculoskeletal: L4-L5 disc spacer and posterior fusion. No acute osseous pathology. IMPRESSION: Sigmoid diverticulitis with possible focally contained microperforation. No organized fluid collection or abscess at this time. Electronically Signed   By: Anner Crete M.D.   On: 09/14/2017 23:12      The results  of significant diagnostics from this hospitalization (including imaging, microbiology, ancillary and laboratory) are listed below for reference.     Microbiology: No results found for this or any previous visit (from the past 240 hour(s)).   Labs: BNP (last 3 results) No results for input(s): BNP in the last 8760 hours. Basic Metabolic Panel: Recent Labs  Lab 09/14/17 1750 09/15/17 0549 09/17/17 0541  NA 138 140 141  K 3.9 3.8 4.0  CL 105 108 108  CO2 24 25 25   GLUCOSE 112* 109* 107*  BUN 26* 20 13  CREATININE 1.51* 1.47* 1.25*  CALCIUM 8.9 8.7* 9.0   Liver Function Tests: Recent Labs  Lab 09/14/17 1750 09/15/17 0549  AST 23 17  ALT 38 29  ALKPHOS 64 51  BILITOT 0.4 1.0  PROT 7.2 6.4*  ALBUMIN 4.2 3.6   Recent Labs  Lab 09/14/17 1750  LIPASE 27   No results for input(s): AMMONIA in the last 168  hours. CBC: Recent Labs  Lab 09/14/17 1750 09/15/17 0549 09/17/17 0541  WBC 7.8 6.3 5.2  NEUTROABS  --  4.3  --   HGB 13.0 12.1* 12.6*  HCT 37.7* 35.7* 37.1*  MCV 90.4 91.5 88.8  PLT 223 205 254   Cardiac Enzymes: No results for input(s): CKTOTAL, CKMB, CKMBINDEX, TROPONINI in the last 168 hours. BNP: Invalid input(s): POCBNP CBG: No results for input(s): GLUCAP in the last 168 hours. D-Dimer No results for input(s): DDIMER in the last 72 hours. Hgb A1c No results for input(s): HGBA1C in the last 72 hours. Lipid Profile No results for input(s): CHOL, HDL, LDLCALC, TRIG, CHOLHDL, LDLDIRECT in the last 72 hours. Thyroid function studies No results for input(s): TSH, T4TOTAL, T3FREE, THYROIDAB in the last 72 hours.  Invalid input(s): FREET3 Anemia work up No results for input(s): VITAMINB12, FOLATE, FERRITIN, TIBC, IRON, RETICCTPCT in the last 72 hours. Urinalysis    Component Value Date/Time   COLORURINE YELLOW 09/14/2017 1704   APPEARANCEUR CLEAR 09/14/2017 1704   LABSPEC 1.015 09/14/2017 1704   PHURINE 7.0 09/14/2017 1704   GLUCOSEU NEGATIVE 09/14/2017 1704   HGBUR NEGATIVE 09/14/2017 1704   BILIRUBINUR NEGATIVE 09/14/2017 1704   KETONESUR NEGATIVE 09/14/2017 1704   PROTEINUR NEGATIVE 09/14/2017 1704   NITRITE NEGATIVE 09/14/2017 1704   LEUKOCYTESUR NEGATIVE 09/14/2017 1704   Sepsis Labs Invalid input(s): PROCALCITONIN,  WBC,  LACTICIDVEN Microbiology No results found for this or any previous visit (from the past 240 hour(s)).   Time coordinating discharge: Over 30 minutes  SIGNED:   Debbe Odea, MD  [facility] 09/17/2017, 3:30 PM Pager   If 7PM-7AM, please contact night-coverage [email or web address] Password [email or web address]

## 2017-09-17 NOTE — Progress Notes (Signed)
Patient discharged to home with family. Given all belongings and instructions. Verbalized understanding of all instructions. Declined wheelchair, ambulated to pov.

## 2017-09-17 NOTE — Progress Notes (Signed)
    CC:  LLQ pain  Subjective: He looks good and has to push hard to feel any discomfort.  No issues with clears and wants to know when he can have real food.    Objective: Vital signs in last 24 hours: Temp:  [97.5 F (36.4 C)-98.8 F (37.1 C)] 97.8 F (36.6 C) (03/15 0511) Pulse Rate:  [63-71] 63 (03/15 0511) Resp:  [14-16] 15 (03/15 0511) BP: (130-139)/(68-85) 135/85 (03/15 0511) SpO2:  [100 %] 100 % (03/15 0511) Last BM Date: 09/16/17 990 PO1160 IV 400 urine recorded/voided x 9 No BM Afebrile, VSS Creatinine is better 1.25 WBC remains normal    Intake/Output from previous day: 03/14 0701 - 03/15 0700 In: 2200 [P.O.:990; I.V.:1160; IV Piggyback:50] Out: 400 [Urine:400] Intake/Output this shift: No intake/output data recorded.  General appearance: alert, cooperative and no distress GI: soft, non-tender; bowel sounds normal; no masses,  no organomegaly and he can feel a "twinge," if he pushes hard enough.    Lab Results:  Recent Labs    09/15/17 0549 09/17/17 0541  WBC 6.3 5.2  HGB 12.1* 12.6*  HCT 35.7* 37.1*  PLT 205 254    BMET Recent Labs    09/15/17 0549 09/17/17 0541  NA 140 141  K 3.8 4.0  CL 108 108  CO2 25 25  GLUCOSE 109* 107*  BUN 20 13  CREATININE 1.47* 1.25*  CALCIUM 8.7* 9.0   PT/INR No results for input(s): LABPROT, INR in the last 72 hours.  Recent Labs  Lab 09/14/17 1750 09/15/17 0549  AST 23 17  ALT 38 29  ALKPHOS 64 51  BILITOT 0.4 1.0  PROT 7.2 6.4*  ALBUMIN 4.2 3.6     Lipase     Component Value Date/Time   LIPASE 27 09/14/2017 1750     Medications: . acetaminophen  1,000 mg Oral Q8H  . allopurinol  300 mg Oral QHS  . amitriptyline  25 mg Oral QHS  . enoxaparin (LOVENOX) injection  40 mg Subcutaneous Q24H  . pantoprazole  40 mg Oral Daily  . topiramate  150 mg Oral Daily    Assessment/Plan Depression/anxiety Gout Migraine Dyslipidemia GERD Mild renal insuffiencey - creatinine 1.51,  1.47,  1.25  Sigmoid diverticulitis with microperforation  - IV abx, IV fluids, start PO clears/pain med  FEN:  IV fluids >> Saline lock/clears >> transition to soft diet and see how he does. ID:  Zosyn 3/12 =>> day 4 dose hanging now transition to PO DVT:  lovenox  Foley:  none Follow up:  Marlou Starks   If he does well, with soft diet may be able to go home later today, he will need 14 days total of abx.  Follow up with Dr. Marlou Starks.       LOS: 2 days    Thurl Boen 09/17/2017 [phone]

## 2017-09-18 NOTE — Progress Notes (Signed)
Husbands wife called asking to speak with the nurse who cared for her husband yesterday. Informed that that nurse is not available and asked if I could help answer her questions.  Patient's wife concerned because patient states he  Has been nauseated all day, and has thrown up several times. She also stated that her husband was admitted for diverticulitis.   Asked wife Chad Gonzales, to call Marlou Starks, MD office, and number was given, informed that the office does have an on call MD, and they should be able to answer her questions. If she is not able to reach anyone or get in touch with PCP office, and the vomiting continues, advised to come in and be seen.

## 2018-08-31 ENCOUNTER — Encounter (INDEPENDENT_AMBULATORY_CARE_PROVIDER_SITE_OTHER): Payer: Self-pay | Admitting: Orthopaedic Surgery

## 2018-08-31 ENCOUNTER — Ambulatory Visit (INDEPENDENT_AMBULATORY_CARE_PROVIDER_SITE_OTHER): Payer: Managed Care, Other (non HMO)

## 2018-08-31 ENCOUNTER — Ambulatory Visit (INDEPENDENT_AMBULATORY_CARE_PROVIDER_SITE_OTHER): Payer: Managed Care, Other (non HMO) | Admitting: Orthopaedic Surgery

## 2018-08-31 DIAGNOSIS — G8929 Other chronic pain: Secondary | ICD-10-CM | POA: Diagnosis not present

## 2018-08-31 DIAGNOSIS — M25562 Pain in left knee: Secondary | ICD-10-CM

## 2018-08-31 MED ORDER — METHYLPREDNISOLONE ACETATE 40 MG/ML IJ SUSP
40.0000 mg | INTRAMUSCULAR | Status: AC | PRN
  Administered 2018-08-31: 40 mg via INTRA_ARTICULAR

## 2018-08-31 MED ORDER — LIDOCAINE HCL 1 % IJ SOLN
3.0000 mL | INTRAMUSCULAR | Status: AC | PRN
  Administered 2018-08-31: 3 mL

## 2018-08-31 NOTE — Progress Notes (Signed)
Office Visit Note   Patient: Chad Gonzales           Date of Birth: 1966/04/20           MRN: [id number] Visit Date: 08/31/2018              Requested by: Jonathon Resides, MD [address] [place], [place] [address] PCP: Jonathon Resides, MD   Assessment & Plan: Visit Diagnoses:  1. Chronic pain of left knee     Plan: I told her about trying a steroid injection again today in his knee to see if this will help with any acute pain.  I would like to see him back in 4 weeks for repeat exam.  I will probably have my partner Dr. Marlou Sa take a look at him as well to see what his thoughts are about the laxity in his knee so we can further discuss what other treatment options may be if he is continuing to have instability symptoms of his knee.  All questions concerns were answered and addressed.  Follow-Up Instructions: Return in about 4 weeks (around 09/28/2018).   Orders:  Orders Placed This Encounter  Procedures  . Large Joint Inj: R knee  . XR KNEE 3 VIEW LEFT   No orders of the defined types were placed in this encounter.     Procedures: Large Joint Inj: R knee on 08/31/2018 3:48 PM Indications: diagnostic evaluation and pain Details: 22 G 1.5 in needle, superolateral approach  Arthrogram: No  Medications: 3 mL lidocaine 1 %; 40 mg methylPREDNISolone acetate 40 MG/ML Outcome: tolerated well, no immediate complications Procedure, treatment alternatives, risks and benefits explained, specific risks discussed. Consent was given by the patient. Immediately prior to procedure a time out was called to verify the correct patient, procedure, equipment, support staff and site/side marked as required. Patient was prepped and draped in the usual sterile fashion.       Clinical Data: No additional findings.   Subjective: Chief Complaint  Patient presents with  . Right Knee - Pain  . Left Knee - Pain  The patient is someone actually grew up with.  He has a history of  an ACL reconstruction of his right knee that was done in [employer] quite some time ago.  He had had an acute injury.  This was about 10 years ago.  He had ACL reconstruction using cadaver allograft tendon.  He says his knee is been hurting quite a bit on a daily basis.  It is mainly the medial joint line where he has problems.  He actually has had an MRI in September 2019 of this right knee.  He has had a steroid injection and it sounds like from notes that accompany him that he is likely had a hyaluronic acid injection in that knee.  He still has mechanical symptoms of pain and feeling at times like the knee may give way.  It does swell on occasion and he wears a knee sleeve on occasion.  He still very active individual with shooting basketball on occasion and a lot of fishing.  Sometimes standing for a long period time because of the need to have some problems.  HPI  Review of Systems He currently denies any headache, chest pain, short of breath, fever, chills, nausea, vomiting  Objective: Vital Signs: There were no vitals taken for this visit.  Physical Exam He is alert and orient x3 and in no acute distress Ortho Exam  Examination of his right knee today shows no effusion.  He has well-healed surgical incisions from previous reconstructive surgery.  There is slight laxity in that knee comparing the knees together.  He has medial joint line tenderness as well along the medial joint line. Specialty Comments:  No specialty comments available.  Imaging: Xr Knee 3 View Left  Result Date: 08/31/2018 3 views of the left knee show some medial joint space narrowing.  There is also evidence from previous ACL reconstruction.  The alignment is otherwise well-maintained.  I did independently reviewed the MRI of his right knee.  There is no meniscal tear but there is some signal changes within the meniscus suggesting some degenerative changes.  There is also some degenerative changes in the cartilage on  the medial compartment of his knee.  There is no full-thickness cartilage loss but there certainly thinning.  The ACL appears intact.  PMFS History: Patient Active Problem List   Diagnosis Date Noted  . Depression with anxiety 09/16/2017  . Sigmoid diverticulitis 09/15/2017  . Other and unspecified hyperlipidemia 02/26/2013  . Gout 02/26/2013  . GERD (gastroesophageal reflux disease) 02/26/2013  . Back pain 02/26/2013   Past Medical History:  Diagnosis Date  . Anxiety   . Arthritis   . Basal cell carcinoma   . Depression   . Diverticulosis   . GERD (gastroesophageal reflux disease)   . Gout, unspecified   . Migraine headache   . Other and unspecified hyperlipidemia     Family History  Problem Relation Age of Onset  . Cancer Mother        Lung Cancer  . Heart disease Father   . Diabetes Maternal Grandmother     Past Surgical History:  Procedure Laterality Date  . ANTERIOR CRUCIATE LIGAMENT REPAIR  04/2010   Right knee  . APPENDECTOMY    . LUMBAR MICRODISCECTOMY  L4-L5  . WISDOM TOOTH EXTRACTION     Social History   Occupational History  . Occupation: FURNITURE Scientist, clinical (histocompatibility and immunogenetics): OTHER    Comment: [employer]  Tobacco Use  . Smoking status: Never Smoker  . Smokeless tobacco: Former Network engineer and Sexual Activity  . Alcohol use: Yes    Comment: 4x week  . Drug use: No  . Sexual activity: Yes

## 2018-09-27 ENCOUNTER — Telehealth (INDEPENDENT_AMBULATORY_CARE_PROVIDER_SITE_OTHER): Payer: Self-pay

## 2018-09-27 NOTE — Telephone Encounter (Signed)
Called about appt Patient r/s due to covid 19

## 2018-09-28 ENCOUNTER — Ambulatory Visit (INDEPENDENT_AMBULATORY_CARE_PROVIDER_SITE_OTHER): Payer: Managed Care, Other (non HMO) | Admitting: Orthopaedic Surgery

## 2019-05-05 MED ORDER — BELLADONNA ALKALOIDS-OPIUM 16.2-30 MG RE SUPP
1.00 | RECTAL | Status: DC
Start: ? — End: 2019-05-05

## 2019-05-05 MED ORDER — ROSUVASTATIN CALCIUM 20 MG PO TABS
20.00 | ORAL_TABLET | ORAL | Status: DC
Start: 2019-05-06 — End: 2019-05-05

## 2019-05-05 MED ORDER — BISACODYL 10 MG RE SUPP
10.00 | RECTAL | Status: DC
Start: ? — End: 2019-05-05

## 2019-05-05 MED ORDER — CITALOPRAM HYDROBROMIDE 10 MG PO TABS
10.00 | ORAL_TABLET | ORAL | Status: DC
Start: 2019-05-06 — End: 2019-05-05

## 2019-05-05 MED ORDER — TRAMADOL HCL 50 MG PO TABS
50.00 | ORAL_TABLET | ORAL | Status: DC
Start: ? — End: 2019-05-05

## 2019-05-05 MED ORDER — ACETAMINOPHEN 500 MG PO TABS
1000.00 | ORAL_TABLET | ORAL | Status: DC
Start: 2019-05-05 — End: 2019-05-05

## 2019-05-05 MED ORDER — KETOROLAC TROMETHAMINE 30 MG/ML IJ SOLN
15.00 | INTRAMUSCULAR | Status: DC
Start: ? — End: 2019-05-05

## 2019-05-05 MED ORDER — MELATONIN 3 MG PO TABS
6.00 | ORAL_TABLET | ORAL | Status: DC
Start: 2019-05-05 — End: 2019-05-05

## 2019-05-05 MED ORDER — ALLOPURINOL 100 MG PO TABS
300.00 | ORAL_TABLET | ORAL | Status: DC
Start: 2019-05-06 — End: 2019-05-05

## 2019-05-05 MED ORDER — GENERIC EXTERNAL MEDICATION
5.00 | Status: DC
Start: ? — End: 2019-05-05

## 2019-05-05 MED ORDER — NORTRIPTYLINE HCL 25 MG PO CAPS
25.00 | ORAL_CAPSULE | ORAL | Status: DC
Start: 2019-05-05 — End: 2019-05-05

## 2019-05-05 MED ORDER — POLYETHYLENE GLYCOL 3350 17 G PO PACK
17.00 | PACK | ORAL | Status: DC
Start: 2019-05-06 — End: 2019-05-05

## 2019-05-05 MED ORDER — PANTOPRAZOLE SODIUM 40 MG PO TBEC
40.00 | DELAYED_RELEASE_TABLET | ORAL | Status: DC
Start: 2019-05-06 — End: 2019-05-05

## 2019-05-05 MED ORDER — PHENOL 1.4 % MT LIQD
1.00 | OROMUCOSAL | Status: DC
Start: ? — End: 2019-05-05

## 2019-10-16 ENCOUNTER — Ambulatory Visit (INDEPENDENT_AMBULATORY_CARE_PROVIDER_SITE_OTHER): Payer: Managed Care, Other (non HMO)

## 2019-10-16 ENCOUNTER — Encounter: Payer: Self-pay | Admitting: Orthopaedic Surgery

## 2019-10-16 ENCOUNTER — Other Ambulatory Visit: Payer: Self-pay

## 2019-10-16 ENCOUNTER — Ambulatory Visit (INDEPENDENT_AMBULATORY_CARE_PROVIDER_SITE_OTHER): Payer: Managed Care, Other (non HMO) | Admitting: Orthopaedic Surgery

## 2019-10-16 DIAGNOSIS — M25562 Pain in left knee: Secondary | ICD-10-CM

## 2019-10-16 DIAGNOSIS — M25521 Pain in right elbow: Secondary | ICD-10-CM

## 2019-10-16 DIAGNOSIS — G8929 Other chronic pain: Secondary | ICD-10-CM

## 2019-10-16 MED ORDER — METHYLPREDNISOLONE ACETATE 40 MG/ML IJ SUSP
40.0000 mg | INTRAMUSCULAR | Status: AC | PRN
  Administered 2019-10-16: 40 mg via INTRA_ARTICULAR

## 2019-10-16 MED ORDER — LIDOCAINE HCL 1 % IJ SOLN
3.0000 mL | INTRAMUSCULAR | Status: AC | PRN
  Administered 2019-10-16: 3 mL

## 2019-10-16 NOTE — Progress Notes (Signed)
Office Visit Note   Patient: Chad Gonzales           Date of Birth: Jun 28, 1966           MRN: [id number] Visit Date: 10/16/2019              Requested by: Chad Resides, MD [address] [place],  [place] [address] PCP: Chad Resides, MD   Assessment & Plan: Visit Diagnoses:  1. Chronic pain of left knee   2. Pain in right elbow     Plan: I do feel is worthwhile placing a steroid in his left knee.  I tried to aspirate fluid off his knee and got a small amount.  The fullness in the back of his knee I believe is a Baker's cyst.  Given his positive Murray sign to the left medial compartment I do feel the steroid is worth trying.  He did tolerate this well and agreed with this treatment plan.  I would like to see him back in 3 weeks because if he still having issues with that knee I would recommend a MRI.  He did ask about hyaluronic acid given the known arthritis he has in his right knee.  I do feel he is a candidate at some point for probably hyaluronic acid if he does not have any mechanical symptoms but just swelling and aching and pain with joint line tenderness.  I gave him a handout to learn more about hyaluronic acid.  I also felt it was worthwhile trying a trigger point injection in the area of the right arm that was hurting and he tolerated this well.  All question concerns were answered and addressed.  I would like to see him back in 3 weeks to see how he is doing overall.  Follow-Up Instructions: Return in about 3 weeks (around 11/06/2019).   Orders:  Orders Placed This Encounter  Procedures  . XR Knee 1-2 Views Left  . XR Elbow Complete Right (3+View)   No orders of the defined types were placed in this encounter.     Procedures: Large Joint Inj: L knee on 10/16/2019 4:01 PM Indications: diagnostic evaluation and pain Details: 22 G 1.5 in needle, superolateral approach  Arthrogram: No  Medications: 3 mL lidocaine 1 %; 40 mg methylPREDNISolone acetate  40 MG/ML Outcome: tolerated well, no immediate complications Procedure, treatment alternatives, risks and benefits explained, specific risks discussed. Consent was given by the patient. Immediately prior to procedure a time out was called to verify the correct patient, procedure, equipment, support staff and site/side marked as required. Patient was prepped and draped in the usual sterile fashion.       Clinical Data: No additional findings.   Subjective: Chief Complaint  Patient presents with  . Right Elbow - Follow-up  Chad Gonzales is well-known to me.  He is actually a friend from my childhood.  He has several orthopedic issues today.  He does wish for me to look at his right arm.  He points to pain at the biceps area of his right upper extremity.  Its the distal third of the biceps and just lateral to the biceps muscle itself.  He feels as if there is a mass there.  He is very physically fit and does a lot of weightlifting as well as fishing and other sports activities.  It hurts during these activities.  He did have an MRI of this area there was performed in 2019.  He  brought this for me to review.  He was told at the time there is really nothing worrisome in that area and no mass.  It is still tender for him.  He denies any dysfunction of the elbow itself.  He denies any numbness and tingling going up or down from this area.  He does have a remote history of an ACL reconstruction of his right knee.  He points to the medial joint line of his knee as a source of his pain.  He has had acute pain and swelling of his left knee after a twisting type of injury to that left knee.  He reports that he was working outside when this happened and he has had fullness in the back of his knee on the left side and has a lot of pain when he starts flexing that knee past 90 degrees.  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  He denies currently any active issues with cancer.  HPI  Review of Systems He currently denies any headache, chest pain, shortness of breath, fever, chills, nausea, vomiting  Objective: Vital Signs: There were no vitals taken for this visit.  Physical Exam He is alert and orient x3 and in no acute distress Ortho Exam Examination of his right elbow shows pain at the junction between the biceps muscle and the lateral arm.  I cannot palpate a mass but I feel an area where he is most tender.  His range of motion of his elbow is full and his ligaments is stable.  There is no skin abnormalities that I can see in this area.  Examination of his left knee does show fullness in the popliteal area with pain on hyperflexion of his left knee.  There is significant medial joint line tenderness and a positive Murray sign the medial aspect of his left knee.  His right knee does show medial joint line tenderness.  He has a history of a previous ACL reconstruction of the right knee. Specialty Comments:  No specialty comments available.  Imaging: No results found.   PMFS History: Patient Active Problem List   Diagnosis Date Noted  . Depression with anxiety 09/16/2017  . Sigmoid diverticulitis 09/15/2017  . Other and unspecified hyperlipidemia 02/26/2013  . Gout 02/26/2013  . GERD (gastroesophageal reflux disease) 02/26/2013  . Back pain 02/26/2013   Past Medical History:  Diagnosis Date  . Anxiety   . Arthritis   . Basal cell carcinoma   . Depression   . Diverticulosis   . GERD (gastroesophageal reflux disease)   . Gout, unspecified   . Migraine headache   . Other and unspecified hyperlipidemia     Family History  Problem Relation Age of Onset  . Cancer Mother        Lung Cancer  . Heart disease Father   . Diabetes Maternal Grandmother     Past Surgical History:  Procedure Laterality Date  . ANTERIOR CRUCIATE LIGAMENT REPAIR  04/2010   Right knee  . APPENDECTOMY    .  LUMBAR MICRODISCECTOMY  L4-L5  . WISDOM TOOTH EXTRACTION     Social History   Occupational History  . Occupation: FURNITURE Scientist, clinical (histocompatibility and immunogenetics): OTHER    Comment: [employer]  Tobacco Use  . Smoking status: Never Smoker  . Smokeless tobacco: Former Network engineer and Sexual Activity  .  Alcohol use: Yes    Comment: 4x week  . Drug use: No  . Sexual activity: Yes

## 2019-11-06 ENCOUNTER — Ambulatory Visit: Payer: Managed Care, Other (non HMO) | Admitting: Orthopaedic Surgery

## 2019-11-09 ENCOUNTER — Other Ambulatory Visit: Payer: Self-pay

## 2019-11-09 ENCOUNTER — Encounter: Payer: Self-pay | Admitting: Orthopaedic Surgery

## 2019-11-09 ENCOUNTER — Ambulatory Visit (INDEPENDENT_AMBULATORY_CARE_PROVIDER_SITE_OTHER): Payer: Managed Care, Other (non HMO) | Admitting: Orthopaedic Surgery

## 2019-11-09 DIAGNOSIS — M25521 Pain in right elbow: Secondary | ICD-10-CM

## 2019-11-09 DIAGNOSIS — G8929 Other chronic pain: Secondary | ICD-10-CM | POA: Diagnosis not present

## 2019-11-09 DIAGNOSIS — M25562 Pain in left knee: Secondary | ICD-10-CM | POA: Diagnosis not present

## 2019-11-09 NOTE — Progress Notes (Signed)
Chad Gonzales comes in today a few weeks after I placed a steroid injection in his left knee.  He has chronic ACL repaired right knee that does hurt on occasion mainly medial and posterior medially.  His left knee has been hurting posterior and posterior medially.  He does state that the steroid injection of left knee has helped temporize the pain in his knee and is done quite well.  He says his only hurts with certain activities.  He denies any locking catching but still reports medial joint line tenderness of the left knee.  I did provide a trigger point injection in his right arm and he said that helped for just a short period of time.  He still has pain at the interval between the biceps and the brachial radialis area and the biceps brachii on the right arm with no other mechanical symptoms or numbness and tingling.  A MRI of this area before by another orthopedic office was unremarkable.  On examination of his left knee today there is no effusion.  He does have medial joint line tenderness on both knees with good function otherwise of both knees.  He still has pain when I palpate between the biceps and the brachialis on the right arm.  He feels like there is a mass in the area but I cannot feel a mass but I do believe he is feeling something in this area because it is definitely painful or I pressed and it may be something that the fascia that is irritating the muscle in this area.  It does not occur on his other side.  This is his dominant side.  It is interesting for the fact the steroid did calm down this area some.  At this point no other recommendation for that arm would be exploring this area and performing any type of fascial release but I would not recommend this unless it became quite problematic for him.  From a knee standpoint, my neck step would be considering hyaluronic acid for his knees.  He said he will call me let me know if he would like to have this ordered at some point.  All questions and  concerns were answered addressed.  Follow-up is otherwise as needed.

## 2020-05-02 ENCOUNTER — Telehealth: Payer: Self-pay

## 2020-05-02 NOTE — Telephone Encounter (Signed)
Patient called in wanting to see if there is any openings this afternoon wants to be seen sooner than 11/1 . Said if not he will wait until Monday

## 2020-05-02 NOTE — Telephone Encounter (Signed)
We do not, I am sorry

## 2020-05-06 ENCOUNTER — Ambulatory Visit (INDEPENDENT_AMBULATORY_CARE_PROVIDER_SITE_OTHER): Payer: Managed Care, Other (non HMO) | Admitting: Orthopaedic Surgery

## 2020-05-06 ENCOUNTER — Ambulatory Visit (INDEPENDENT_AMBULATORY_CARE_PROVIDER_SITE_OTHER): Payer: Managed Care, Other (non HMO)

## 2020-05-06 ENCOUNTER — Other Ambulatory Visit: Payer: Self-pay

## 2020-05-06 ENCOUNTER — Encounter: Payer: Self-pay | Admitting: Orthopaedic Surgery

## 2020-05-06 DIAGNOSIS — M25512 Pain in left shoulder: Secondary | ICD-10-CM | POA: Diagnosis not present

## 2020-05-06 DIAGNOSIS — Z96612 Presence of left artificial shoulder joint: Secondary | ICD-10-CM

## 2020-05-06 NOTE — Progress Notes (Signed)
Office Visit Note   Patient: Chad Gonzales           Date of Birth: 09-Jan-1966           MRN: [id number] Visit Date: 05/06/2020              Requested by: Chad Resides, MD [address] [place],  [place] [address] PCP: Chad Resides, MD   Assessment & Plan: Visit Diagnoses:  1. Acute pain of left shoulder     Plan: Given the significant shoulder weakness that he has on my exam, a MRI of the left shoulder is warranted to rule out a rotator cuff tear as well as assess the [facility] joint for separation.  I am also concerned about the possibility of a fracture or impaction of the humeral head so this can be seen on MRI as well.  For now he will rest from contact sports and take anti-inflammatories as needed.  We will see him back after this MRI which I believe at this point is clinically medically warranted based on his exam.  Follow-Up Instructions: Return in about 2 weeks (around 05/20/2020).   Orders:  Orders Placed This Encounter  Procedures  . XR Shoulder Left   No orders of the defined types were placed in this encounter.     Procedures: No procedures performed   Clinical Data: No additional findings.   Subjective: Chief Complaint  Patient presents with  . Left Shoulder - Pain  Chad Gonzales is well-known to me.  He comes in for evaluation treatment of an acute left shoulder injury.  He was playing softball and he dove for the ball and landed on his left shoulder and significantly jammed it against his body.  He has had a lot of pain with overhead activities and a lot of clicking in that shoulder.  He is at also weakness of the shoulder girdle and rotator cuff itself.  He denies any neck pain and denies any numbness tingling going down his left hand.  He is never injured the shoulder before or had surgery on the shoulder before.  He is a very athletic 54 year old.  HPI  Review of Systems There is currently no listed headache, chest pain, shortness of breath,  fever, chills, nausea, vomiting  Objective: Vital Signs: There were no vitals taken for this visit.  Physical Exam He is alert and orient x3 and in no acute distress Ortho Exam Examination of his left shoulder shows severe pain at the [facility] joint as well as the anterior shoulder.  There is also some posterior shoulder pain and weakness of the rotator cuff with external rotation as well as weakness with liftoff. Specialty Comments:  No specialty comments available.  Imaging: XR Shoulder Left  Result Date: 05/06/2020 3 views of the left shoulder show no acute findings.    PMFS History: Patient Active Problem List   Diagnosis Date Noted  . Depression with anxiety 09/16/2017  . Sigmoid diverticulitis 09/15/2017  . Other and unspecified hyperlipidemia 02/26/2013  . Gout 02/26/2013  . GERD (gastroesophageal reflux disease) 02/26/2013  . Back pain 02/26/2013   Past Medical History:  Diagnosis Date  . Anxiety   . Arthritis   . Basal cell carcinoma   . Depression   . Diverticulosis   . GERD (gastroesophageal reflux disease)   . Gout, unspecified   . Migraine headache   . Other and unspecified hyperlipidemia     Family History  Problem Relation Age  of Onset  . Cancer Mother        Lung Cancer  . Heart disease Father   . Diabetes Maternal Grandmother     Past Surgical History:  Procedure Laterality Date  . ANTERIOR CRUCIATE LIGAMENT REPAIR  04/2010   Right knee  . APPENDECTOMY    . LUMBAR MICRODISCECTOMY  L4-L5  . WISDOM TOOTH EXTRACTION     Social History   Occupational History  . Occupation: FURNITURE Scientist, clinical (histocompatibility and immunogenetics): OTHER    Comment: [employer]  Tobacco Use  . Smoking status: Never Smoker  . Smokeless tobacco: Former Network engineer  . Vaping Use: Never used  Substance and Sexual Activity  . Alcohol use: Yes    Comment: 4x week  . Drug use: No  . Sexual activity: Yes

## 2020-05-09 ENCOUNTER — Ambulatory Visit
Admission: RE | Admit: 2020-05-09 | Discharge: 2020-05-09 | Disposition: A | Discharge status: Home / Self Care | Payer: Managed Care, Other (non HMO) | Source: Ambulatory Visit | Attending: Orthopaedic Surgery | Admitting: Orthopaedic Surgery

## 2020-05-09 ENCOUNTER — Other Ambulatory Visit: Payer: Self-pay

## 2020-05-09 DIAGNOSIS — Z96612 Presence of left artificial shoulder joint: Secondary | ICD-10-CM

## 2020-05-14 ENCOUNTER — Telehealth: Payer: Self-pay | Admitting: Orthopaedic Surgery

## 2020-05-14 NOTE — Telephone Encounter (Signed)
Pt called asking if Dr.Blackman would be willing to give him his MRI results over the phone?  [phone]

## 2020-05-16 ENCOUNTER — Telehealth: Payer: Self-pay | Admitting: Orthopaedic Surgery

## 2020-05-16 NOTE — Telephone Encounter (Signed)
Patient called asked for a call back. Patient said he is anxious to know the results of his MRI. The number to contact patient is [phone]

## 2020-05-17 NOTE — Telephone Encounter (Signed)
I spoke to [place] last night.  I did go over the MRI with him.  I need to see him in the office next week at some point to put a steroid injection in his left shoulder.  I told him that someone would call to get him an appointment for next week.  Thanks

## 2020-05-17 NOTE — Telephone Encounter (Signed)
Lvm for pt to call back to schedule this appointment

## 2020-05-28 ENCOUNTER — Ambulatory Visit (INDEPENDENT_AMBULATORY_CARE_PROVIDER_SITE_OTHER): Payer: Managed Care, Other (non HMO) | Admitting: Orthopaedic Surgery

## 2020-05-28 ENCOUNTER — Encounter: Payer: Self-pay | Admitting: Orthopaedic Surgery

## 2020-05-28 DIAGNOSIS — M25512 Pain in left shoulder: Secondary | ICD-10-CM

## 2020-05-28 MED ORDER — METHYLPREDNISOLONE ACETATE 40 MG/ML IJ SUSP
40.0000 mg | INTRAMUSCULAR | Status: AC | PRN
  Administered 2020-05-28: 40 mg via INTRA_ARTICULAR

## 2020-05-28 MED ORDER — LIDOCAINE HCL 1 % IJ SOLN
3.0000 mL | INTRAMUSCULAR | Status: AC | PRN
  Administered 2020-05-28: 3 mL

## 2020-05-28 NOTE — Progress Notes (Signed)
Office Visit Note   Patient: Chad Gonzales           Date of Birth: October 22, 1965           MRN: [id number] Visit Date: 05/28/2020              Requested by: Jonathon Resides, MD [address] [place],  [place] [address] PCP: Jonathon Resides, MD   Assessment & Plan: Visit Diagnoses:  1. Acute pain of left shoulder     Plan: I counseled him extensively about resting his shoulder.  I did provide a steroid injection in the subacromial outlet and one around the [facility] joint.  I would like to see him back in 8 weeks after rest of the shoulder to see how this is done from a steroid standpoint.  I can always consider repeating injections then as well.  Worse case and there would be an arthroscopic intervention.  All question concerns were answered and addressed.  Follow-Up Instructions: No follow-ups on file.   Orders:  No orders of the defined types were placed in this encounter.  No orders of the defined types were placed in this encounter.     Procedures: Large Joint Inj: L subacromial bursa on 05/28/2020 9:22 AM Indications: pain and diagnostic evaluation Details: 22 G 1.5 in needle  Arthrogram: No  Medications: 3 mL lidocaine 1 %; 40 mg methylPREDNISolone acetate 40 MG/ML Outcome: tolerated well, no immediate complications Procedure, treatment alternatives, risks and benefits explained, specific risks discussed. Consent was given by the patient. Immediately prior to procedure a time out was called to verify the correct patient, procedure, equipment, support staff and site/side marked as required. Patient was prepped and draped in the usual sterile fashion.       Clinical Data: No additional findings.   Subjective: Chief Complaint  Patient presents with  . Left Shoulder - Follow-up  The patient comes in today to go over the MRI of his left shoulder.  He had acute injury to the shoulder but has had some chronic shoulder issues.  He is a very athletic individual  and does work out quite a bit.  I was able to call him at home to go over the MRI and he is coming in today also for steroid injection in his left shoulder.  There is been no other acute changes in his medical status.  HPI  Review of Systems There is currently no headache, chest pain, shortness of breath, fever, chills, nausea, vomiting  Objective: Vital Signs: There were no vitals taken for this visit.  Physical Exam He is alert and orient x3 and in no acute distress Ortho Exam Examination of his left shoulder shows no weakness but pain at the subacromial outlet and the [facility] joint and anterior shoulder pain. Specialty Comments:  No specialty comments available.  Imaging: No results found. The MRI does show an injury to the subscapularis tendon but no detachment.  There is also moderate AC joint arthritic changes and moderate glenohumeral and joint changes.  The infraspinatus and supraspinatus tendons are intact but have tendinopathy.  PMFS History: Patient Active Problem List   Diagnosis Date Noted  . Depression with anxiety 09/16/2017  . Sigmoid diverticulitis 09/15/2017  . Other and unspecified hyperlipidemia 02/26/2013  . Gout 02/26/2013  . GERD (gastroesophageal reflux disease) 02/26/2013  . Back pain 02/26/2013   Past Medical History:  Diagnosis Date  . Anxiety   . Arthritis   . Basal cell  carcinoma   . Depression   . Diverticulosis   . GERD (gastroesophageal reflux disease)   . Gout, unspecified   . Migraine headache   . Other and unspecified hyperlipidemia     Family History  Problem Relation Age of Onset  . Cancer Mother        Lung Cancer  . Heart disease Father   . Diabetes Maternal Grandmother     Past Surgical History:  Procedure Laterality Date  . ANTERIOR CRUCIATE LIGAMENT REPAIR  04/2010   Right knee  . APPENDECTOMY    . LUMBAR MICRODISCECTOMY  L4-L5  . WISDOM TOOTH EXTRACTION     Social History   Occupational History  . Occupation: FURNITURE  Scientist, clinical (histocompatibility and immunogenetics): OTHER    Comment: [employer]  Tobacco Use  . Smoking status: Never Smoker  . Smokeless tobacco: Former Network engineer  . Vaping Use: Never used  Substance and Sexual Activity  . Alcohol use: Yes    Comment: 4x week  . Drug use: No  . Sexual activity: Yes

## 2020-07-07 ENCOUNTER — Emergency Department (HOSPITAL_BASED_OUTPATIENT_CLINIC_OR_DEPARTMENT_OTHER): Payer: Managed Care, Other (non HMO)

## 2020-07-07 ENCOUNTER — Encounter (HOSPITAL_BASED_OUTPATIENT_CLINIC_OR_DEPARTMENT_OTHER): Payer: Self-pay | Admitting: Emergency Medicine

## 2020-07-07 ENCOUNTER — Other Ambulatory Visit: Payer: Self-pay

## 2020-07-07 ENCOUNTER — Emergency Department (HOSPITAL_BASED_OUTPATIENT_CLINIC_OR_DEPARTMENT_OTHER)
Admission: EM | Admit: 2020-07-07 | Discharge: 2020-07-07 | Disposition: A | Discharge status: Home / Self Care | Payer: Managed Care, Other (non HMO) | Attending: Emergency Medicine | Admitting: Emergency Medicine

## 2020-07-07 DIAGNOSIS — Z87891 Personal history of nicotine dependence: Secondary | ICD-10-CM | POA: Insufficient documentation

## 2020-07-07 DIAGNOSIS — R739 Hyperglycemia, unspecified: Secondary | ICD-10-CM

## 2020-07-07 DIAGNOSIS — R7309 Other abnormal glucose: Secondary | ICD-10-CM | POA: Diagnosis not present

## 2020-07-07 DIAGNOSIS — R519 Headache, unspecified: Secondary | ICD-10-CM | POA: Diagnosis present

## 2020-07-07 DIAGNOSIS — Z85828 Personal history of other malignant neoplasm of skin: Secondary | ICD-10-CM | POA: Insufficient documentation

## 2020-07-07 LAB — URINALYSIS, ROUTINE W REFLEX MICROSCOPIC
Bilirubin Urine: NEGATIVE
Glucose, UA: NEGATIVE mg/dL
Hgb urine dipstick: NEGATIVE
Ketones, ur: NEGATIVE mg/dL
Leukocytes,Ua: NEGATIVE
Nitrite: NEGATIVE
Protein, ur: NEGATIVE mg/dL
Specific Gravity, Urine: 1.01 (ref 1.005–1.030)
pH: 7 (ref 5.0–8.0)

## 2020-07-07 LAB — CBC WITH DIFFERENTIAL/PLATELET
Abs Immature Granulocytes: 0.04 10*3/uL (ref 0.00–0.07)
Basophils Absolute: 0 10*3/uL (ref 0.0–0.1)
Basophils Relative: 0 %
Eosinophils Absolute: 0 10*3/uL (ref 0.0–0.5)
Eosinophils Relative: 0 %
HCT: 45.7 % (ref 39.0–52.0)
Hemoglobin: 16.1 g/dL (ref 13.0–17.0)
Immature Granulocytes: 1 %
Lymphocytes Relative: 13 %
Lymphs Abs: 1.1 10*3/uL (ref 0.7–4.0)
MCH: 31.6 pg (ref 26.0–34.0)
MCHC: 35.2 g/dL (ref 30.0–36.0)
MCV: 89.6 fL (ref 80.0–100.0)
Monocytes Absolute: 0.4 10*3/uL (ref 0.1–1.0)
Monocytes Relative: 5 %
Neutro Abs: 6.8 10*3/uL (ref 1.7–7.7)
Neutrophils Relative %: 81 %
Platelets: 172 10*3/uL (ref 150–400)
RBC: 5.1 MIL/uL (ref 4.22–5.81)
RDW: 12.7 % (ref 11.5–15.5)
WBC: 8.4 10*3/uL (ref 4.0–10.5)
nRBC: 0 % (ref 0.0–0.2)

## 2020-07-07 LAB — BASIC METABOLIC PANEL
Anion gap: 12 (ref 5–15)
BUN: 24 mg/dL — ABNORMAL HIGH (ref 6–20)
CO2: 24 mmol/L (ref 22–32)
Calcium: 9.5 mg/dL (ref 8.9–10.3)
Chloride: 101 mmol/L (ref 98–111)
Creatinine, Ser: 1.31 mg/dL — ABNORMAL HIGH (ref 0.61–1.24)
GFR, Estimated: >60 mL/min (ref >60)
Glucose, Bld: 143 mg/dL — ABNORMAL HIGH (ref 70–99)
Potassium: 4 mmol/L (ref 3.5–5.1)
Sodium: 137 mmol/L (ref 135–145)

## 2020-07-07 MED ORDER — IOHEXOL 350 MG/ML SOLN
100.0000 mL | Freq: Once | INTRAVENOUS | Status: AC | PRN
  Administered 2020-07-07: 100 mL via INTRAVENOUS

## 2020-07-07 MED ORDER — SODIUM CHLORIDE 0.9 % IV BOLUS
1000.0000 mL | Freq: Once | INTRAVENOUS | Status: AC
  Administered 2020-07-07: 1000 mL via INTRAVENOUS

## 2020-07-07 MED ORDER — DIPHENHYDRAMINE HCL 50 MG/ML IJ SOLN
25.0000 mg | Freq: Once | INTRAMUSCULAR | Status: AC
  Administered 2020-07-07: 25 mg via INTRAVENOUS
  Filled 2020-07-07: qty 1

## 2020-07-07 MED ORDER — METOCLOPRAMIDE HCL 5 MG/ML IJ SOLN
10.0000 mg | Freq: Once | INTRAMUSCULAR | Status: AC
  Administered 2020-07-07: 10 mg via INTRAVENOUS
  Filled 2020-07-07: qty 2

## 2020-07-07 MED ORDER — DEXAMETHASONE SODIUM PHOSPHATE 10 MG/ML IJ SOLN
10.0000 mg | Freq: Once | INTRAMUSCULAR | Status: AC
  Administered 2020-07-07: 10 mg via INTRAVENOUS
  Filled 2020-07-07: qty 1

## 2020-07-07 NOTE — Discharge Instructions (Addendum)
Follow-up with your primary care doctor within the next few days for recheck.  Your blood sugar was a little high you need to be rechecked by your primary care doctor.  Return to the emergency room if you have any worsening symptoms including worsening headache, vomiting, fevers or other worsening symptoms.

## 2020-07-07 NOTE — ED Provider Notes (Addendum)
[facility] EMERGENCY DEPARTMENT Provider Note   CSN: [id number] Arrival date & time: 07/07/20  0751     History Chief Complaint  Patient presents with  . Headache    Race Chad Gonzales is a 55 y.o. male.  Patient is a 55 year old male who presents with a headache.  He states that he was having intercourse yesterday morning around 11 AM and as he was getting aroused, he had a sudden onset of pain in the back of his head.  He said like it felt like a knife stabbing him.  He stopped the intercourse and he said that his headache gradually improved throughout the day.  Of note, he did use Viagra prior to the intercourse that morning.  He said that he tried to have sex again yesterday evening and the same thing happen with an intense pain to the back of his head.  He has some radiation down into his neck but says his neck pain is mild.  He has had an ongoing headache since that time and it has been associated with nausea and vomiting throughout the night.  He does have a history of migraines but he says his migraines are usually in his bitemporal area.  He has not had a migraine since he has been on Topamax.  No history of aneurysms.  He denies any numbness or weakness in his extremities other than he has some numbness in his left toe which is chronic and he thinks it is from his back.  It is unchanged from his baseline.  No difficulty with his balance.  No fevers.  No cough or cold symptoms.        Past Medical History:  Diagnosis Date  . Anxiety   . Arthritis   . Basal cell carcinoma   . Depression   . Diverticulosis   . GERD (gastroesophageal reflux disease)   . Gout, unspecified   . Migraine headache   . Other and unspecified hyperlipidemia     Patient Active Problem List   Diagnosis Date Noted  . Depression with anxiety 09/16/2017  . Sigmoid diverticulitis 09/15/2017  . Other and unspecified hyperlipidemia 02/26/2013  . Gout 02/26/2013  . GERD (gastroesophageal  reflux disease) 02/26/2013  . Back pain 02/26/2013    Past Surgical History:  Procedure Laterality Date  . ANTERIOR CRUCIATE LIGAMENT REPAIR  04/2010   Right knee  . APPENDECTOMY    . LUMBAR MICRODISCECTOMY  L4-L5  . WISDOM TOOTH EXTRACTION         Family History  Problem Relation Age of Onset  . Cancer Mother        Lung Cancer  . Heart disease Father   . Diabetes Maternal Grandmother     Social History   Tobacco Use  . Smoking status: Never Smoker  . Smokeless tobacco: Former Network engineer  . Vaping Use: Never used  Substance Use Topics  . Alcohol use: Yes    Comment: 4x week  . Drug use: No    Home Medications Prior to Admission medications   Medication Sig Start Date End Date Taking? Authorizing Provider  allopurinol (ZYLOPRIM) 300 MG tablet Take 1 tablet (300 mg total) by mouth daily. Patient not taking: Reported on 09/15/2017 01/19/14 12/04/16  Jonathon Resides, MD  allopurinol (ZYLOPRIM) 300 MG tablet TAKE ONE TABLET BY MOUTH DAILY 01/12/17 03/02/19  [provider]  amitriptyline (ELAVIL) 25 MG tablet Take 25 mg by mouth at bedtime.    [provider]  amoxicillin-clavulanate (AUGMENTIN) 875-125 MG tablet Take 1 tablet by mouth 2 (two) times daily. One po bid x 7 days Patient not taking: Reported on 08/31/2018 09/17/17   Debbe Odea, MD  atorvastatin (LIPITOR) 40 MG tablet Take 1 tablet (40 mg total) by mouth daily. Patient not taking: Reported on 09/15/2017 07/25/13 07/25/14  Jonathon Resides, MD  citalopram (CELEXA) 10 MG tablet Take by mouth. 06/23/18   [provider]  colchicine (COLCRYS) 0.6 MG tablet Take 1 tablet (0.6 mg total) by mouth 2 (two) times daily. PRN For gout attack Patient not taking: Reported on 09/15/2017 01/19/13 01/19/14  Jonathon Resides, MD  dicyclomine (BENTYL) 10 MG capsule Take 10 mg by mouth 4 (four) times daily.    [provider]  DULoxetine (CYMBALTA) 30 MG capsule Take 30 mg by mouth daily.     [provider]  fenofibrate 160 MG tablet Take 160 mg by mouth daily.    [provider]  meloxicam (MOBIC) 15 MG tablet Take 15 mg by mouth daily.    [provider]  metaxalone (SKELAXIN) 800 MG tablet Take 800 mg by mouth 3 (three) times daily as needed for muscle spasms.    [provider]  nortriptyline (PAMELOR) 25 MG capsule Take by mouth. 05/27/18   [provider]  ondansetron (ZOFRAN) 4 MG tablet Take 1 tablet (4 mg total) by mouth every 8 (eight) hours as needed for nausea or vomiting. Patient not taking: Reported on 08/31/2018 09/17/17   Debbe Odea, MD  pantoprazole (PROTONIX) 40 MG tablet Take 1 tablet (40 mg total) by mouth daily. 01/19/14 09/15/17  Jonathon Resides, MD  pantoprazole (PROTONIX) 40 MG tablet Take by mouth. 03/01/18 03/01/19  [provider]  rosuvastatin (CRESTOR) 40 MG tablet Take 20-40 mg by mouth daily.     [provider]  topiramate (TOPAMAX) 50 MG tablet Take 150 mg by mouth daily.     [provider]    Allergies    Codeine  Review of Systems   Review of Systems  Constitutional: Negative for chills, diaphoresis, fatigue and fever.  HENT: Negative for congestion, rhinorrhea and sneezing.   Eyes: Negative.   Respiratory: Negative for cough, chest tightness and shortness of breath.   Cardiovascular: Negative for chest pain and leg swelling.  Gastrointestinal: Positive for nausea and vomiting. Negative for abdominal pain, blood in stool and diarrhea.  Genitourinary: Negative for difficulty urinating, flank pain, frequency and hematuria.  Musculoskeletal: Positive for neck pain. Negative for arthralgias and back pain.  Skin: Negative for rash.  Neurological: Positive for headaches. Negative for dizziness, speech difficulty, weakness and numbness.    Physical Exam Updated Vital Signs BP (!) 142/87 (BP Location: Right Arm)   Pulse 71   Temp 98.3 F (36.8 C) (Oral)   Resp 16   Ht 5'  9" (1.753 m)   Wt 90.7 kg   SpO2 99%   BMI 29.53 kg/m   Physical Exam Constitutional:      Appearance: He is well-developed and well-nourished.  HENT:     Head: Normocephalic and atraumatic.  Eyes:     Pupils: Pupils are equal, round, and reactive to light.  Neck:     Comments: No meningismus Cardiovascular:     Rate and Rhythm: Normal rate and regular rhythm.     Heart sounds: Normal heart sounds.  Pulmonary:     Effort: Pulmonary effort is normal. No respiratory distress.     Breath  sounds: Normal breath sounds. No wheezing or rales.  Chest:     Chest wall: No tenderness.  Abdominal:     General: Bowel sounds are normal.     Palpations: Abdomen is soft.     Tenderness: There is no abdominal tenderness. There is no guarding or rebound.  Musculoskeletal:        General: No edema. Normal range of motion.     Cervical back: Normal range of motion and neck supple.  Lymphadenopathy:     Cervical: No cervical adenopathy.  Skin:    General: Skin is warm and dry.     Findings: No rash.  Neurological:     Mental Status: He is alert and oriented to person, place, and time.     Comments: Motor 5/5 all extremities Sensation grossly intact to LT all extremities Finger to Nose intact, no pronator drift CN II-XII grossly intact Gait normal   Psychiatric:        Mood and Affect: Mood and affect normal.     ED Results / Procedures / Treatments   Labs (all labs ordered are listed, but only abnormal results are displayed) Labs Reviewed  BASIC METABOLIC PANEL - Abnormal; Notable for the following components:      Result Value   Glucose, Bld 143 (*)    BUN 24 (*)    Creatinine, Ser 1.31 (*)    All other components within normal limits  CBC WITH DIFFERENTIAL/PLATELET  URINALYSIS, ROUTINE W REFLEX MICROSCOPIC    EKG None  Radiology CT Angio Head W/Cm &/Or Wo Cm  Result Date: 07/07/2020 CLINICAL DATA:  55 year old male with sudden onset headache during intercourse. Query  subarachnoid hemorrhage, aneurysm. EXAM: CT ANGIOGRAPHY HEAD TECHNIQUE: Multidetector CT imaging of the head was performed using the standard protocol during bolus administration of intravenous contrast. Multiplanar CT image reconstructions and MIPs were obtained to evaluate the vascular anatomy. CONTRAST:  OMNIPAQUE IOHEXOL 350 MG/ML SOLN COMPARISON:  Report of noncontrast head CT 06/16/2004 (no images available). FINDINGS: CT HEAD Brain: Normal cerebral volume. No midline shift, ventriculomegaly, mass effect, evidence of mass lesion, intracranial hemorrhage or evidence of cortically based acute infarction. Gray-white matter differentiation is within normal limits throughout the brain. No IVH. Calvarium and skull base: Negative. Paranasal sinuses: Visualized paranasal sinuses and mastoids are clear. Orbits: Visualized orbits and scalp soft tissues are within normal limits. CTA HEAD Posterior circulation: distal left vertebral artery is dominant and primarily supplies the basilar. A diminutive right V4 segment and right PICA origin are patent. Normal left PICA origin. No distal vertebral stenosis. Patent basilar artery, bilateral AICA, SCA and PCA origins without stenosis. Posterior communicating arteries are diminutive or absent. Bilateral PCA branches are within normal limits. Anterior circulation: Tortuous distal cervical ICAs just below the skull base (series 11, image 1). Both ICA siphons are patent without plaque or stenosis. Patent and normal carotid termini. Patent MCA and ACA origins. The right A1 is mildly dominant. Normal anterior communicating artery. Bilateral ACA branches are within normal limits. Left MCA M1 segment and bifurcation are patent without stenosis. Right MCA M1 and bifurcation are patent without stenosis. Bilateral MCA branches are within normal limits. Venous sinuses: Patent superior sagittal sinus, torcula and transverse sinuses. Early contrast timing in the sigmoid sinuses.  Anatomic variants: Dominant left vertebral artery, right V4 vertebral is diminutive. Review of the MIP images confirms the above findings IMPRESSION: 1. Normal for age CT appearance of the brain. 2. Negative intracranial CTA, no intracranial aneurysm, atherosclerosis  or stenosis. Electronically Signed   By: Genevie Ann M.D.   On: 07/07/2020 09:31    Procedures Procedures (including critical care time)  Medications Ordered in ED Medications  iohexol (OMNIPAQUE) 350 MG/ML injection 100 mL (100 mLs Intravenous Contrast Given 07/07/20 0905)  metoCLOPramide (REGLAN) injection 10 mg (10 mg Intravenous Given 07/07/20 1012)  dexamethasone (DECADRON) injection 10 mg (10 mg Intravenous Given 07/07/20 1014)  diphenhydrAMINE (BENADRYL) injection 25 mg (25 mg Intravenous Given 07/07/20 1011)  sodium chloride 0.9 % bolus 1,000 mL (0 mLs Intravenous Stopped 07/07/20 1120)    ED Course  I have reviewed the triage vital signs and the nursing notes.  Pertinent labs & imaging results that were available during my care of the patient were reviewed by me and considered in my medical decision making (see chart for details).    MDM Rules/Calculators/A&P                          Patient is a 55 year old male who presents with a sudden headache yesterday morning but then it worsened again yesterday evening.  He had some concern for subarachnoid hemorrhage given his presentation.  Given that it has been greater than 6 hours, a CT/CTA was done.  There is no evidence of subarachnoid hemorrhage.  No evidence of an aneurysm.  This is reassuring.  He was given a migraine cocktail and his headache has significantly subsided.  His labs show a creatinine which is mildly elevated.  It similar to prior values.  His glucose is mildly elevated.  I advised him to have this rechecked by his PCP.  He has no neurologic deficits or signs of a stroke.  He does not have fever or other symptoms that would be more suggestive of meningitis.  He was  given IV fluids.  He was discharged home in good condition.  He was encouraged to follow-up with his PCP within the next few days.  Return precautions were given.  I did advise him that it would be prudent to stop taking the Viagra given that this can trigger headaches. Final Clinical Impression(s) / ED Diagnoses Final diagnoses:  Bad headache  Elevated blood sugar    Rx / DC Orders ED Discharge Orders    None       Malvin Johns, MD 07/07/20 1150    Malvin Johns, MD 07/07/20 1151

## 2020-07-07 NOTE — ED Triage Notes (Addendum)
Pt reports sharp piercing headache during sex yesterday. The pain was so bad that he had to stop. The pain subsided. States last night they tried again and the same thing happened when he became erect and he vomited. He states the headache has persisted this time and he has continued to vomit. He also reports that he took viagra yesterday.

## 2020-07-07 NOTE — ED Notes (Signed)
ED Provider at bedside. 

## 2020-07-23 ENCOUNTER — Ambulatory Visit: Payer: Managed Care, Other (non HMO) | Admitting: Orthopaedic Surgery

## 2021-05-15 IMAGING — CT CT ANGIO HEAD
3 of 9 series · 15 of 47 positions shown · IV contrast (omnipaque)
Comparison: Report of noncontrast head CT 06/16/2004 (no images
available).

CLINICAL DATA: 54-year-old male with sudden onset headache during
intercourse. Query subarachnoid hemorrhage, aneurysm.

EXAM:
CT ANGIOGRAPHY HEAD
TECHNIQUE: Multidetector CT imaging of the head was performed using the
standard protocol during bolus administration of intravenous
contrast. Multiplanar CT image reconstructions and MIPs were
obtained to evaluate the vascular anatomy.
CONTRAST:  100mL OMNIPAQUE IOHEXOL 350 MG/ML SOLN

[Series 11: ax thin · axial · 0.39mm/px · z∈[+865,+986]mm · 9 of 145 slices shown]
[im 12/145  brain]
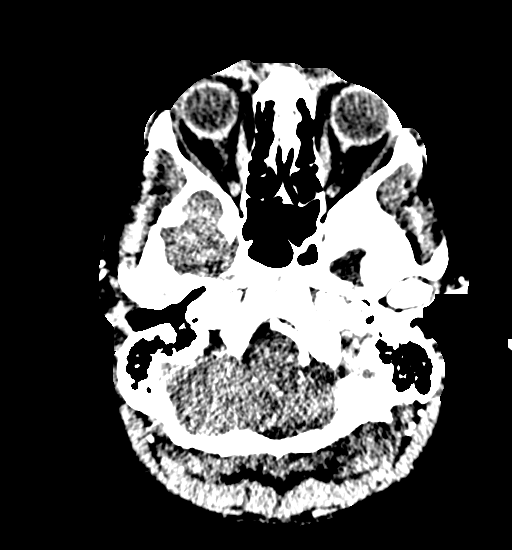
[im 34/145  bone]
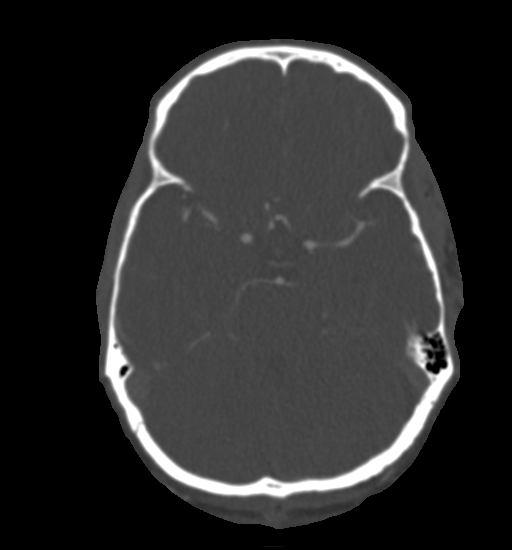
[im 45/145  brain]
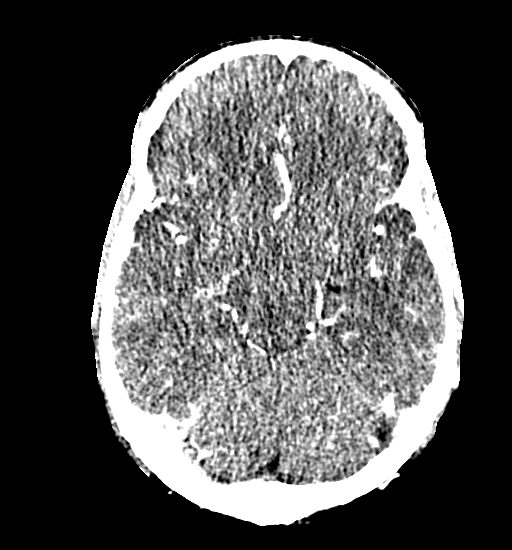
[im 56/145  bone]
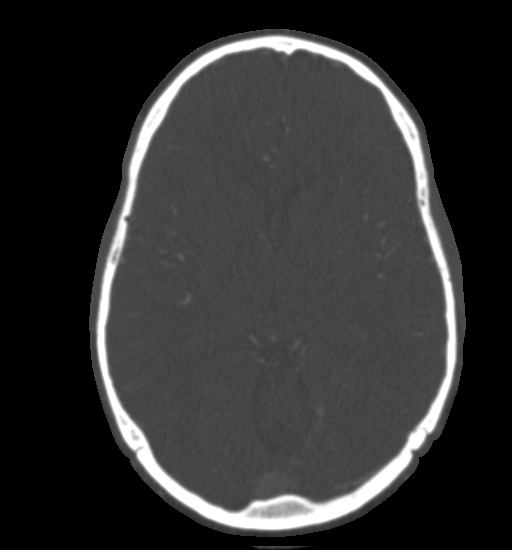
[im 78/145  brain]
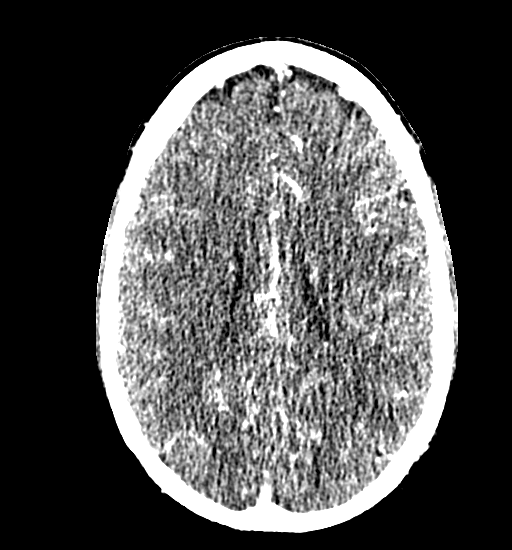
[im 89/145  bone]
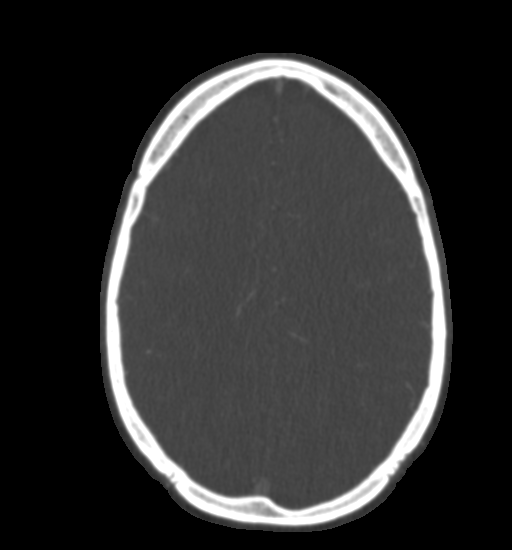
[im 100/145  brain]
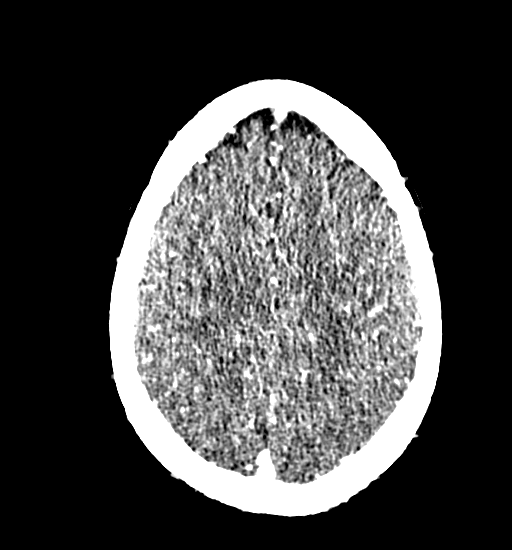
[im 122/145  bone]
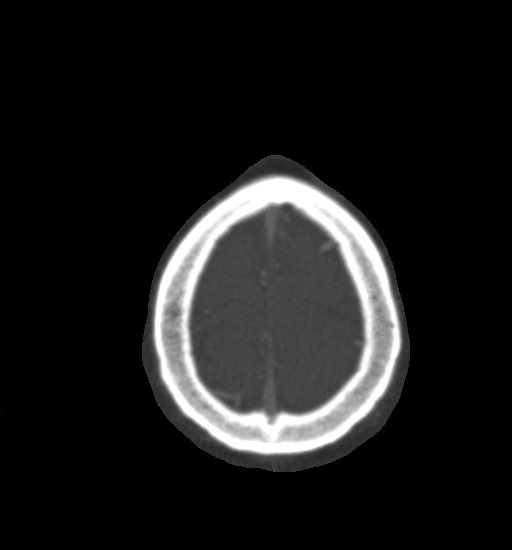
[im 133/145  brain]
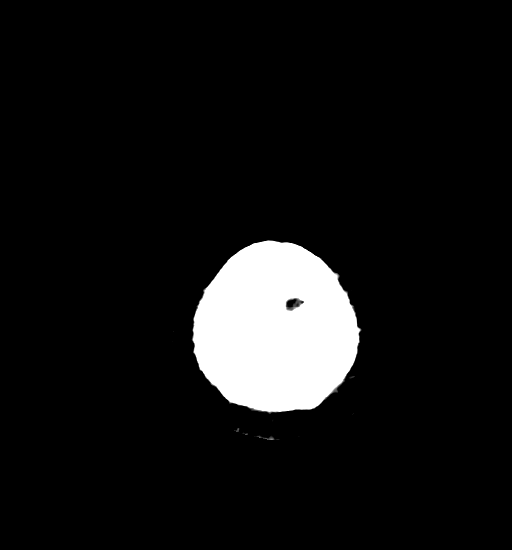

[Series 13: cor thin · coronal · 0.30mm/px · 3 of 202 slices shown]
[im 58/202  brain]
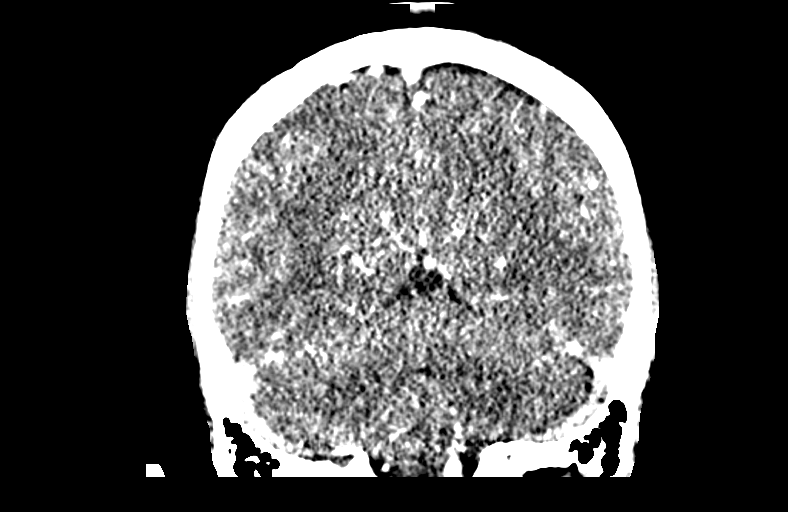
[im 87/202  brain]
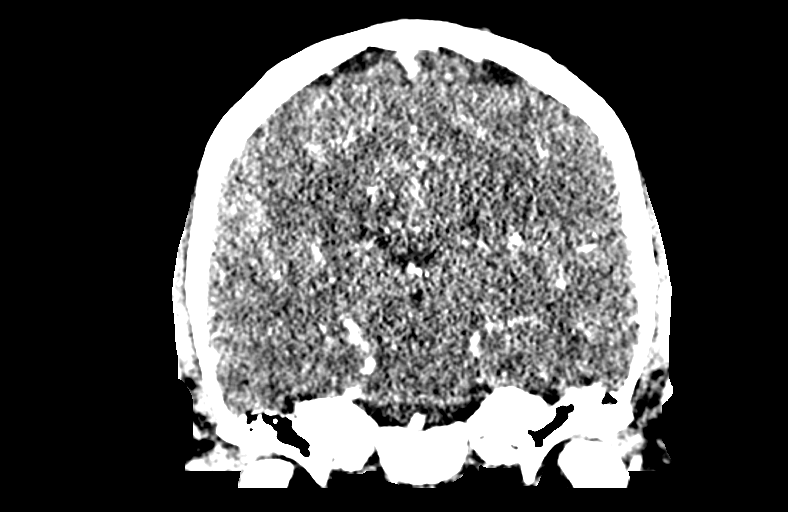
[im 115/202  brain]
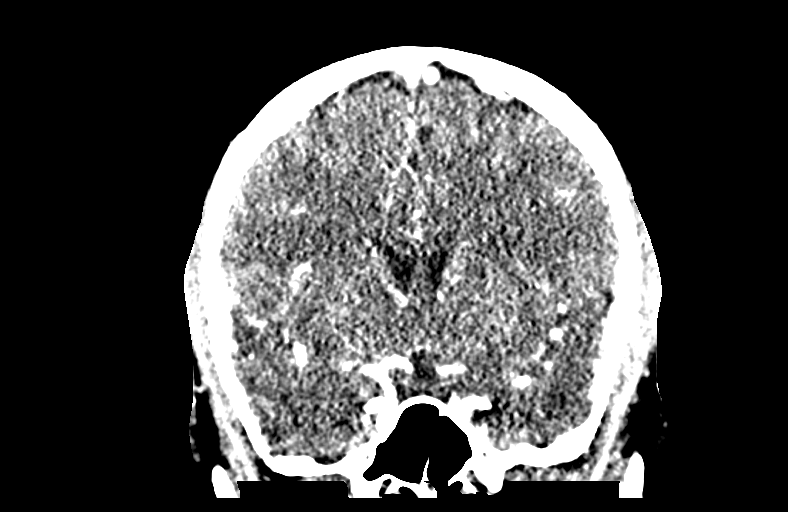

[Series 15: sag thin · sagittal · 0.30mm/px · 3 of 201 slices shown]
[im 41/201  brain]
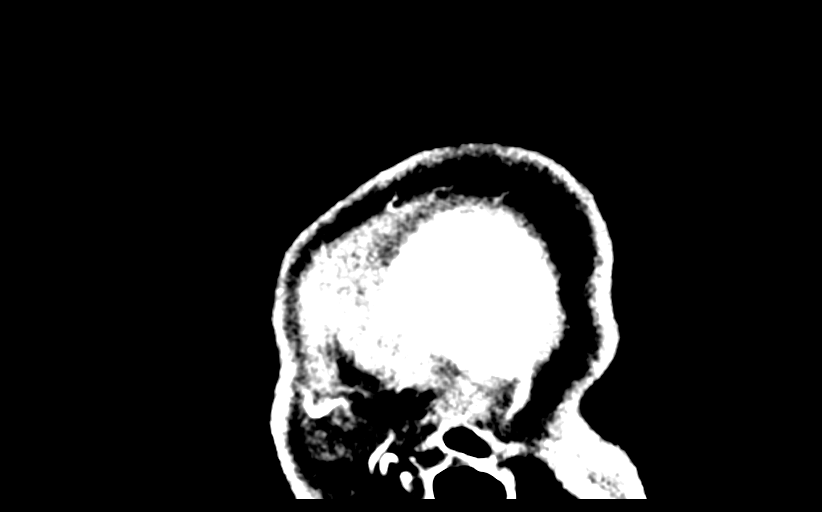
[im 81/201  brain]
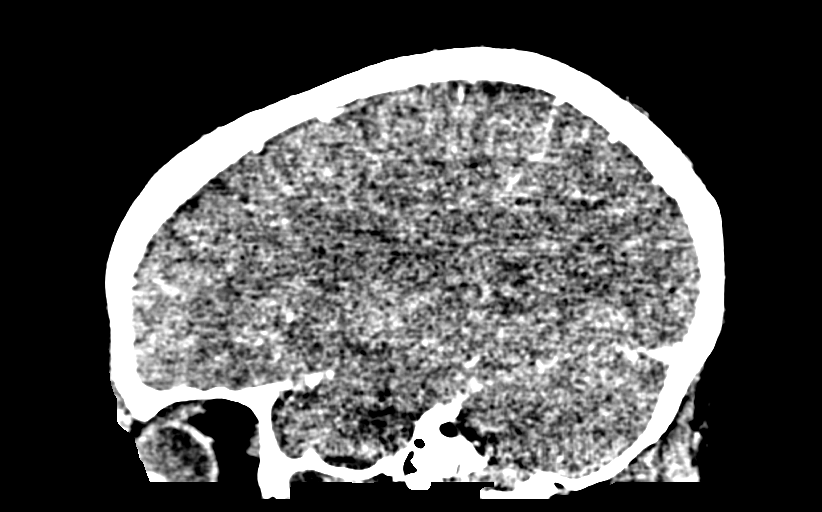
[im 121/201  brain]
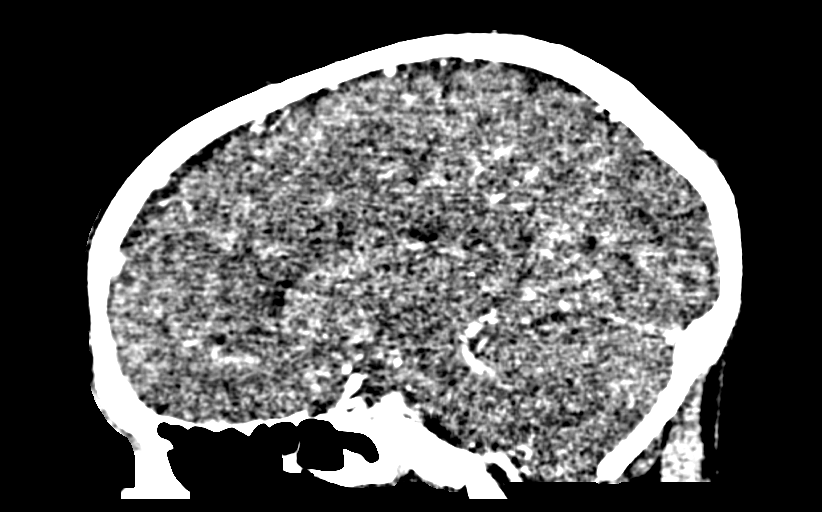

[15 of 47 positions shown; findings below may reference images not displayed]

FINDINGS: CT HEAD

Brain: Normal cerebral volume. No midline shift, ventriculomegaly,
mass effect, evidence of mass lesion, intracranial hemorrhage or
evidence of cortically based acute infarction. Gray-white matter
differentiation is within normal limits throughout the brain. No
IVH.

Calvarium and skull base: Negative.

Paranasal sinuses: Visualized paranasal sinuses and mastoids are
clear.

Orbits: Visualized orbits and scalp soft tissues are within normal
limits.

CTA HEAD

Posterior circulation: distal left vertebral artery is dominant and
primarily supplies the basilar. A diminutive right V4 segment and
right PICA origin are patent. Normal left PICA origin. No distal
vertebral stenosis. Patent basilar artery, bilateral AICA, SCA and
PCA origins without stenosis. Posterior communicating arteries are
diminutive or absent. Bilateral PCA branches are within normal
limits.

Anterior circulation: Tortuous distal cervical ICAs just below the
skull base (series 11, image 1). Both ICA siphons are patent without
plaque or stenosis. Patent and normal carotid termini. Patent MCA
and ACA origins. The right A1 is mildly dominant. Normal anterior
communicating artery. Bilateral ACA branches are within normal
limits. Left MCA M1 segment and bifurcation are patent without
stenosis. Right MCA M1 and bifurcation are patent without stenosis.
Bilateral MCA branches are within normal limits.

Venous sinuses: Patent superior sagittal sinus, torcula and
transverse sinuses. Early contrast timing in the sigmoid sinuses.

Anatomic variants: Dominant left vertebral artery, right V4
vertebral is diminutive.

Review of the MIP images confirms the above findings
IMPRESSION: 1. Normal for age CT appearance of the brain.
2. Negative intracranial CTA, no intracranial aneurysm,
atherosclerosis or stenosis.

## 2022-01-21 ENCOUNTER — Encounter: Payer: Self-pay | Admitting: Orthopaedic Surgery

## 2022-01-21 ENCOUNTER — Ambulatory Visit (INDEPENDENT_AMBULATORY_CARE_PROVIDER_SITE_OTHER): Payer: Managed Care, Other (non HMO)

## 2022-01-21 ENCOUNTER — Ambulatory Visit: Payer: Managed Care, Other (non HMO) | Admitting: Orthopaedic Surgery

## 2022-01-21 VITALS — Ht 70.0 in | Wt 196.6 lb

## 2022-01-21 DIAGNOSIS — M25562 Pain in left knee: Secondary | ICD-10-CM

## 2022-01-21 MED ORDER — METHYLPREDNISOLONE ACETATE 40 MG/ML IJ SUSP
40.0000 mg | INTRAMUSCULAR | Status: AC | PRN
  Administered 2022-01-21: 40 mg via INTRA_ARTICULAR

## 2022-01-21 MED ORDER — LIDOCAINE HCL 1 % IJ SOLN
3.0000 mL | INTRAMUSCULAR | Status: AC | PRN
  Administered 2022-01-21: 3 mL

## 2022-01-21 NOTE — Progress Notes (Signed)
Office Visit Note   Patient: Chad Gonzales           Date of Birth: [birth year]           MRN: [id number] Visit Date: 01/21/2022              Requested by: Jonathon Resides, MD [place],  [place] [id number] PCP: Jonathon Resides, MD   Assessment & Plan: Visit Diagnoses:  1. Acute pain of left knee     Plan: We will see him back on as-needed basis.  If his pain persist or becomes worse he can always call the office with obtain an MRI of the knee.  Did discuss with him quad strengthening exercises and extremity exercises.  Knee was wrapped with an Ace bandage which she will remove before retiring to bed this evening.  Questions encouraged and answered by Dr. Delilah Shan and myself.  Follow-Up Instructions: Return if symptoms worsen or fail to improve.   Orders:  Orders Placed This Encounter  Procedures   Large Joint Inj   XR Knee 1-2 Views Left   No orders of the defined types were placed in this encounter.     Procedures: Large Joint Inj: L knee on 01/21/2022 8:56 AM Indications: pain Details: 22 G 1.5 in needle, anterolateral approach  Arthrogram: No  Medications: 3 mL lidocaine 1 %; 40 mg methylPREDNISolone acetate 40 MG/ML Aspirate: 2 mL yellow Outcome: tolerated well, no immediate complications Procedure, treatment alternatives, risks and benefits explained, specific risks discussed. Consent was given by the patient. Immediately prior to procedure a time out was called to verify the correct patient, procedure, equipment, support staff and site/side marked as required. Patient was prepped and draped in the usual sterile fashion.       Clinical Data: No additional findings.   Subjective: Chief Complaint  Patient presents with   Left Knee - Pain    HPI Patient is 56 year old male comes in today with left knee pain has been ongoing for the past 2 weeks.  He notes significant swelling in the knee he had to wear compression stocking.  He states  swelling has been going down.  He has had it giving way of the knee occasionally.  But no other mechanical symptoms.  No injury to the knee.  Tried knee sleeve Voltaren gel.  Feels knee pain is overall improving.  Most of his pain he feels is underneath the kneecap to the medial side of the knee.  History of right ACL surgery years ago. Review of Systems See HPI otherwise negative noncontributory.  Objective: Vital Signs: Ht '5\' 10"'$  (1.778 m)   Wt 196 lb 9.6 oz (89.2 kg)   BMI 28.21 kg/m   Physical Exam Constitutional:      Appearance: He is not ill-appearing or diaphoretic.  Pulmonary:     Effort: Pulmonary effort is normal.  Neurological:     Mental Status: He is alert.  Psychiatric:        Mood and Affect: Mood normal.     Ortho Exam Bilateral knees full range of motion.  Patellofemoral crepitus with passive range of motion both knees.  No instability valgus varus stressing bilateral knees.  Lachman's negative bilaterally.  McMurray's left knee is negative.  Some slight edema left knee compared to the right.  No abnormal warmth erythema of either knee.  Manipulation bilateral patella ankles causes discomfort.  No joint line tenderness either knee. Specialty Comments:  No specialty comments available.  Imaging: XR Knee 1-2 Views Left  Result Date: 01/21/2022 Left knee 2 views: No acute fractures.  Knee is well located.  Mild medial compartmental narrowing.  No bony abnormalities otherwise.    PMFS History: Patient Active Problem List   Diagnosis Date Noted   Depression with anxiety 09/16/2017   Sigmoid diverticulitis 09/15/2017   Other and unspecified hyperlipidemia 02/26/2013   Gout 02/26/2013   GERD (gastroesophageal reflux disease) 02/26/2013   Back pain 02/26/2013   Past Medical History:  Diagnosis Date   Anxiety    Arthritis    Basal cell carcinoma    Depression    Diverticulosis    GERD (gastroesophageal reflux disease)    Gout, unspecified    Migraine  headache    Other and unspecified hyperlipidemia     Family History  Problem Relation Age of Onset   Cancer Mother        Lung Cancer   Heart disease Father    Diabetes Maternal Grandmother     Past Surgical History:  Procedure Laterality Date   ANTERIOR CRUCIATE LIGAMENT REPAIR  04/2010   Right knee   APPENDECTOMY     LUMBAR MICRODISCECTOMY  L4-L5   WISDOM TOOTH EXTRACTION     Social History   Occupational History   Occupation: FURNITURE Scientist, clinical (histocompatibility and immunogenetics): OTHER    Comment: [employer]  Tobacco Use   Smoking status: Never   Smokeless tobacco: Former    Quit date: 04/05/2009  Vaping Use   Vaping Use: Never used  Substance and Sexual Activity   Alcohol use: Yes    Comment: 4x week   Drug use: No   Sexual activity: Yes

## 2023-02-18 ENCOUNTER — Ambulatory Visit (INDEPENDENT_AMBULATORY_CARE_PROVIDER_SITE_OTHER): Payer: Managed Care, Other (non HMO)

## 2023-02-18 ENCOUNTER — Encounter (HOSPITAL_BASED_OUTPATIENT_CLINIC_OR_DEPARTMENT_OTHER): Payer: Self-pay | Admitting: Student

## 2023-02-18 ENCOUNTER — Ambulatory Visit (HOSPITAL_BASED_OUTPATIENT_CLINIC_OR_DEPARTMENT_OTHER): Payer: Managed Care, Other (non HMO) | Admitting: Student

## 2023-02-18 DIAGNOSIS — M25561 Pain in right knee: Secondary | ICD-10-CM | POA: Diagnosis not present

## 2023-02-18 NOTE — Progress Notes (Signed)
Chief Complaint: Right knee pain     History of Present Illness:    Chad Gonzales is a 57 y.o. male presenting today for evaluation of right knee pain.  This began about 2 weeks ago when his knee slightly twisted while standing up.  He did have some immediate pain, however he was hoping this would resolve over time.  He has had little improvement in his pain levels which are still moderate today.  Pain is mainly located on the medial aspect of the knee and he has noticed a decrease in stability.  Denies any increase in popping or clicking, however knee does feel like it catches occasionally.  Has tried Advil as well as Voltaren gel and a knee brace.  Does have a history of ACL reconstruction back in 2011.  Works at [employer] and enjoys fishing.   Surgical History:   Right ACL reconstruction 2011  PMH/PSH/Family History/Social History/Meds/Allergies:    Past Medical History:  Diagnosis Date   Anxiety    Arthritis    Basal cell carcinoma    Depression    Diverticulosis    GERD (gastroesophageal reflux disease)    Gout, unspecified    Migraine headache    Other and unspecified hyperlipidemia    Past Surgical History:  Procedure Laterality Date   ANTERIOR CRUCIATE LIGAMENT REPAIR  04/2010   Right knee   APPENDECTOMY     LUMBAR MICRODISCECTOMY  L4-L5   WISDOM TOOTH EXTRACTION     Social History   Socioeconomic History   Marital status: Married    Spouse name: SHERRY   Number of children: 2   Years of education: Not on file   Highest education level: Not on file  Occupational History   Occupation: FURNITURE Investment banker, corporate: OTHER    Comment: [employer]  Tobacco Use   Smoking status: Never   Smokeless tobacco: Former    Quit date: 04/05/2009  Vaping Use   Vaping status: Never Used  Substance and Sexual Activity   Alcohol use: Yes    Comment: 4x week   Drug use: No   Sexual activity: Yes  Other Topics  Concern   Not on file  Social History Narrative   Marital Status:  Married Chief of Staff)   Children:  Son (Adam); Daughter Irving Burton)    Pets:  Boxer Facilities manager)    Living Situation: Lives with spouse and children.   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  He quit in 04/2009    Alcohol Use: Beer (12 pack/Week)    Drug Use:  None    Diet:  Regular    Exercise:  Basketball, Softball   Hobbies:  Fishing, Golf, Curator            Social Determinants of Health   Financial Resource Strain: Low Risk  (09/14/2022)   Received from [facility]   Overall Financial Resource Strain (CARDIA)    Difficulty of Paying Living Expenses: Not hard at all  Food Insecurity: No Food Insecurity (09/14/2022)   Received from [facility]   Hunger Vital Sign    Worried About Running Out of Food in the Last Year: Never true    Ran Out of Food in  the Last Year: Never true  Transportation Needs: No Transportation Needs (09/14/2022)   Received from [facility] - Transportation    Lack of Transportation (Medical): No    Lack of Transportation (Non-Medical): No  Physical Activity: Sufficiently Active (09/14/2022)   Received from [facility]   Exercise Vital Sign    Days of Exercise per Week: 4 days    Minutes of Exercise per Session: 60 min  Stress: No Stress Concern Present (09/14/2022)   Received from [facility] - Occupational Stress Questionnaire    Feeling of Stress : Only a little  Social Connections: Socially Integrated (09/14/2022)   Received from [facility]   Social Network    How would you rate your social network (family, work, friends)?: Good participation with social networks   Family History  Problem Relation Age of Onset   Cancer Mother        Lung Cancer   Heart disease Father    Diabetes Maternal Grandmother    Allergies  Allergen  Reactions   Codeine Nausea Only   Current Outpatient Medications  Medication Sig Dispense Refill   allopurinol (ZYLOPRIM) 300 MG tablet Take 1 tablet (300 mg total) by mouth daily. (Patient not taking: Reported on 09/15/2017) 90 tablet 3   allopurinol (ZYLOPRIM) 300 MG tablet TAKE ONE TABLET BY MOUTH DAILY     amitriptyline (ELAVIL) 25 MG tablet Take 25 mg by mouth at bedtime.     amoxicillin-clavulanate (AUGMENTIN) 875-125 MG tablet Take 1 tablet by mouth 2 (two) times daily. One po bid x 7 days (Patient not taking: Reported on 08/31/2018) 24 tablet 0   atorvastatin (LIPITOR) 40 MG tablet Take 1 tablet (40 mg total) by mouth daily. (Patient not taking: Reported on 09/15/2017) 30 tablet 5   citalopram (CELEXA) 10 MG tablet Take by mouth.     colchicine (COLCRYS) 0.6 MG tablet Take 1 tablet (0.6 mg total) by mouth 2 (two) times daily. PRN For gout attack (Patient not taking: Reported on 09/15/2017) 60 tablet 11   dicyclomine (BENTYL) 10 MG capsule Take 10 mg by mouth 4 (four) times daily.     DULoxetine (CYMBALTA) 30 MG capsule Take 30 mg by mouth daily.     fenofibrate 160 MG tablet Take 160 mg by mouth daily.     meloxicam (MOBIC) 15 MG tablet Take 15 mg by mouth daily.     metaxalone (SKELAXIN) 800 MG tablet Take 800 mg by mouth 3 (three) times daily as needed for muscle spasms.     nortriptyline (PAMELOR) 25 MG capsule Take by mouth.     ondansetron (ZOFRAN) 4 MG tablet Take 1 tablet (4 mg total) by mouth every 8 (eight) hours as needed for nausea or vomiting. (Patient not taking: Reported on 08/31/2018) 30 tablet 0   pantoprazole (PROTONIX) 40 MG tablet Take 1 tablet (40 mg total) by mouth daily. 360 tablet 0   pantoprazole (PROTONIX) 40 MG tablet Take by mouth.     rosuvastatin (CRESTOR) 40 MG tablet Take 20-40 mg by mouth daily.      topiramate (TOPAMAX) 50 MG tablet Take 150 mg by mouth daily.      No current facility-administered medications for this visit.   No results found.  Review  of Systems:   A ROS was performed including pertinent positives and negatives as documented in the HPI.  Physical Exam :   Constitutional: NAD and appears stated age Neurological: Alert  and oriented Psych: Appropriate affect and cooperative There were no vitals taken for this visit.   Comprehensive Musculoskeletal Exam:    Medial joint line tenderness of the right knee.  No palpable effusion.  Active range of motion from 0 to 130 degrees.  No instability with varus or valgus stress.  Negative Lachman.  Positive medial McMurray's.  Neurosensory exam intact.  Imaging:   Xray (right knee 4 views): Negative for acute fracture or dislocation.  Postsurgical ACL reconstruction screws noted.    I personally reviewed and interpreted the radiographs.   Assessment:   57 y.o. male with acute right knee pain after twisting injury 2 weeks ago.  Based on his exam with joint line tenderness and positive McMurray's as well as occasional mechanical symptoms, I am suspicious of a meniscal injury.  Based on this, I do recommend obtaining an MRI of the right knee for further evaluation.  Discussed options for symptom control in the meantime including anti-inflammatories or cortisone injection.  Patient would like to proceed with injection which was performed today under ultrasound guidance without any complication.  He does understand if anything required surgery, we would have to wait at least 6 weeks before proceeding.  Recommend that he can continue to wear a brace as needed for added stability.  Plan :    -Obtain MRI of the right knee and follow-up shortly after to review results     I personally saw and evaluated the patient, and participated in the management and treatment plan.  Hazle Nordmann, PA-C Orthopedics  This document was dictated using Conservation officer, historic buildings. A reasonable attempt at proof reading has been made to minimize errors.

## 2023-02-21 ENCOUNTER — Ambulatory Visit: Payer: Managed Care, Other (non HMO)

## 2023-02-21 DIAGNOSIS — M25561 Pain in right knee: Secondary | ICD-10-CM

## 2023-03-01 ENCOUNTER — Telehealth (HOSPITAL_BASED_OUTPATIENT_CLINIC_OR_DEPARTMENT_OTHER): Payer: Self-pay

## 2023-03-01 NOTE — Telephone Encounter (Signed)
error 

## 2023-03-04 ENCOUNTER — Telehealth: Payer: Self-pay | Admitting: Student

## 2023-03-04 NOTE — Telephone Encounter (Signed)
Please call back, thanks

## 2023-03-04 NOTE — Telephone Encounter (Signed)
Spoke with pt and discussed results. He is following up next week with Dr. Magnus Ivan for further discussion.

## 2023-03-04 NOTE — Telephone Encounter (Signed)
Patient called returning the call from [place].CB#[phone]

## 2023-03-10 ENCOUNTER — Encounter: Payer: Self-pay | Admitting: Orthopaedic Surgery

## 2023-03-10 ENCOUNTER — Ambulatory Visit: Payer: Managed Care, Other (non HMO) | Admitting: Orthopaedic Surgery

## 2023-03-10 DIAGNOSIS — M25561 Pain in right knee: Secondary | ICD-10-CM

## 2023-03-10 DIAGNOSIS — S83241D Other tear of medial meniscus, current injury, right knee, subsequent encounter: Secondary | ICD-10-CM | POA: Diagnosis not present

## 2023-03-10 DIAGNOSIS — M25562 Pain in left knee: Secondary | ICD-10-CM

## 2023-03-10 DIAGNOSIS — G8929 Other chronic pain: Secondary | ICD-10-CM

## 2023-03-10 NOTE — Progress Notes (Signed)
Chad Gonzales is a 57 year old friend of mine who have known since high school.  He comes in today to go over a MRI of his right knee.  He had had a recent injury to that knee which was a twisting injury getting out of a boat and has had sharp pain with locking and catching since then in his right knee.  This is new to him.  In 2010 he did have an ACL reconstruction.  He has had no issues up until recently.  His pain is along the medial joint line of that knee.  A sterile injection only helped temporize his pain.  The MRI is reviewed with him of his right knee.  He does have a medial meniscal tear of that knee.  There is also thinning of the cartilage on the medial compartment of his knee but it did not show any full-thickness cartilage defects.  The lateral compartment of his knee is normal as well as the patellofemoral joint.  His ACL reconstruction is intact.  Due to his continued right knee pain with locking and catching, an arthroscopic intervention is warranted at this point to assess the cartilage on the medial aspect of his knee as well as to address the meniscal tear.  I did explain in detail what the surgery involves as well as the risk and benefits of surgery.  We will work on getting this scheduled more at his convenience based on his work schedule.  All questions and concerns were addressed and answered.

## 2023-03-19 ENCOUNTER — Telehealth: Payer: Self-pay

## 2023-03-19 NOTE — Telephone Encounter (Signed)
I called patient to discuss scheduling surgery.  Left voice mail for return call.

## 2023-05-05 ENCOUNTER — Other Ambulatory Visit (INDEPENDENT_AMBULATORY_CARE_PROVIDER_SITE_OTHER): Payer: Managed Care, Other (non HMO)

## 2023-05-05 ENCOUNTER — Ambulatory Visit (INDEPENDENT_AMBULATORY_CARE_PROVIDER_SITE_OTHER): Payer: Managed Care, Other (non HMO) | Admitting: Orthopaedic Surgery

## 2023-05-05 ENCOUNTER — Other Ambulatory Visit: Payer: Self-pay | Admitting: Radiology

## 2023-05-05 ENCOUNTER — Encounter: Payer: Self-pay | Admitting: Orthopaedic Surgery

## 2023-05-05 DIAGNOSIS — M25562 Pain in left knee: Secondary | ICD-10-CM | POA: Diagnosis not present

## 2023-05-05 DIAGNOSIS — G8929 Other chronic pain: Secondary | ICD-10-CM

## 2023-05-05 MED ORDER — METHYLPREDNISOLONE ACETATE 40 MG/ML IJ SUSP
40.0000 mg | INTRAMUSCULAR | Status: AC | PRN
Start: 2023-05-05 — End: 2023-05-05
  Administered 2023-05-05: 40 mg via INTRA_ARTICULAR

## 2023-05-05 MED ORDER — LIDOCAINE HCL 1 % IJ SOLN
5.0000 mL | INTRAMUSCULAR | Status: AC | PRN
Start: 2023-05-05 — End: 2023-05-05
  Administered 2023-05-05: 5 mL

## 2023-05-05 NOTE — Progress Notes (Signed)
Procedure Note  Patient: Chad Gonzales             Date of Birth: 1965/10/18           MRN: [id number]             Visit Date: 05/05/2023  HPI: Chad Gonzales comes in today complaining mostly of left knee pain.  He is scheduled for right knee arthroscopy due to a meniscal tear in the center.  He states that the left knee has been bothering for last 3 to 4 weeks no known injury.  He notes the knee gives way.  Has been using pullover knee sleeve and over-the-counter NSAIDs.  He points to the medial aspect of the left knee as source of his pain.  He also notes that he has had swelling of the knee.  At this point in time he feels the left knee bothers him more so than the right.  Review of systems: Denies any fevers chills or recent vaccines.  Physical exam: Bilateral knees: Good range of motion.  Left knee tenderness along medial joint line.  Positive effusion no abnormal warmth erythema of either knee.  Left knee no instability valgus varus stressing.  Anterior drawer left knee is negative.  McMurray's is negative on the left.  Crepitus with passive range of motion both knees.  Radiographs: AP lateral view left knee: Shows moderate narrowing medial compartment.  Lateral compartment overall well-preserved.  Patellofemoral compartment mild spurring off the superior pole.  Procedures: Visit Diagnoses:  1. Chronic pain of left knee     Large Joint Inj: L knee on 05/05/2023 11:35 AM Indications: pain Details: 22 G 1.5 in needle, superolateral approach  Arthrogram: No  Medications: 5 mL lidocaine 1 %; 40 mg methylPREDNISolone acetate 40 MG/ML Aspirate: 22 mL yellow Outcome: tolerated well, no immediate complications Procedure, treatment alternatives, risks and benefits explained, specific risks discussed. Consent was given by the patient. Immediately prior to procedure a time out was called to verify the correct patient, procedure, equipment, support staff and site/side marked as required.  Patient was prepped and draped in the usual sterile fashion.     Plan: Will obtain an MRI of his left knee to rule out meniscal tear given his mechanical symptoms and effusion.  Having follow-up after the MRI to go over results discuss further treatment.  Questions were encouraged and answered by Dr. Magnus Ivan and myself.

## 2023-05-23 ENCOUNTER — Inpatient Hospital Stay
Admission: RE | Admit: 2023-05-23 | Discharge: 2023-05-23 | Discharge status: Home / Self Care | Payer: Managed Care, Other (non HMO) | Source: Ambulatory Visit | Attending: Orthopaedic Surgery | Admitting: Orthopaedic Surgery

## 2023-05-23 DIAGNOSIS — M25562 Pain in left knee: Secondary | ICD-10-CM

## 2023-05-24 ENCOUNTER — Telehealth: Payer: Self-pay | Admitting: Orthopaedic Surgery

## 2023-05-24 NOTE — Telephone Encounter (Signed)
Patient called and said he got the MRI is done and he said what do you know about the left knee because he can't get around on that too good right now. CB#[phone]

## 2023-05-27 ENCOUNTER — Telehealth: Payer: Self-pay | Admitting: Orthopaedic Surgery

## 2023-05-27 NOTE — Telephone Encounter (Signed)
Patient called and said he did his MRI and it been sent over to you. He stating that he is in very pain to the point where he is hopping on it. He did the MRI and they denied him after he did it. CB#[phone]

## 2023-05-31 ENCOUNTER — Ambulatory Visit (INDEPENDENT_AMBULATORY_CARE_PROVIDER_SITE_OTHER): Payer: Managed Care, Other (non HMO) | Admitting: Orthopaedic Surgery

## 2023-05-31 ENCOUNTER — Other Ambulatory Visit: Payer: Self-pay

## 2023-05-31 ENCOUNTER — Encounter: Payer: Self-pay | Admitting: Orthopaedic Surgery

## 2023-05-31 DIAGNOSIS — M1712 Unilateral primary osteoarthritis, left knee: Secondary | ICD-10-CM

## 2023-05-31 NOTE — Progress Notes (Signed)
Chad Gonzales comes in today to go over MRI of his left knee.  We originally had him scheduled for a right knee arthroscopy but his left knee has taken precedence in terms of amount of pain he is having his left knee.  He is 57 years old and very active.  The MRI of his left knee shows significant cartilage wear throughout the medial compartment of his knee.  There is a wide area of full-thickness cartilage loss at the weightbearing surface of the medial femoral condyle but also the peripheral aspect of the medial tibial plateau.  His ACL and PCL are intact.  The lateral compartment of his knee is pristine.  He has an extensive left medial meniscal tear.  There is some cartilage changes at the medial patella facet but he has been asymptomatic with patellofemoral disease.  On exam he can squat down easily.  There is some fullness to his left knee in terms and effusion.  I could not elicit any type of patellofemoral crepitation.  His knee is ligamentously stable with full range of motion.  He has varus malalignment that is only slight and is easily correctable.  All of his pain seems to be along the medial compartment and not lateral.  I would like to send him to one my colleagues for another opinion as a relates to the patient being a partial knee replacement candidate versus a total knee replacement candidate.  He agrees with this referral as well.  We had planned to perform a right knee arthroscopy on him in 2 weeks we will work on it cancel that surgery so he can see Dr. Dion Saucier for an opinion as a relates to partial knee replacement.  I did place a steroid injection per his request and his left knee today.  I cannot aspirate any fluid off of that knee.  He will keep me posted about what he finds out.

## 2023-07-08 ENCOUNTER — Other Ambulatory Visit (HOSPITAL_BASED_OUTPATIENT_CLINIC_OR_DEPARTMENT_OTHER): Payer: Self-pay | Admitting: Nurse Practitioner

## 2023-07-08 DIAGNOSIS — E782 Mixed hyperlipidemia: Secondary | ICD-10-CM

## 2023-07-20 ENCOUNTER — Ambulatory Visit (HOSPITAL_BASED_OUTPATIENT_CLINIC_OR_DEPARTMENT_OTHER)
Admission: RE | Admit: 2023-07-20 | Discharge: 2023-07-20 | Disposition: A | Discharge status: Home / Self Care | Payer: Self-pay | Source: Ambulatory Visit | Attending: Nurse Practitioner | Admitting: Nurse Practitioner

## 2023-07-20 DIAGNOSIS — E782 Mixed hyperlipidemia: Secondary | ICD-10-CM | POA: Insufficient documentation

## 2023-08-03 ENCOUNTER — Ambulatory Visit (HOSPITAL_BASED_OUTPATIENT_CLINIC_OR_DEPARTMENT_OTHER): Payer: Managed Care, Other (non HMO)

## 2024-05-08 ENCOUNTER — Encounter: Payer: Self-pay | Admitting: Radiology
